# Patient Record
Sex: Female | Born: 1980 | Race: White | Hispanic: No | Marital: Single | Smoking: Current every day smoker
Health system: Southern US, Community
[De-identification: ages and names within clinical notes are randomized; demographics above are authoritative.]

## PROBLEM LIST (undated history)

## (undated) DIAGNOSIS — F319 Bipolar disorder, unspecified: Secondary | ICD-10-CM

## (undated) DIAGNOSIS — J45909 Unspecified asthma, uncomplicated: Secondary | ICD-10-CM

## (undated) DIAGNOSIS — F4310 Post-traumatic stress disorder, unspecified: Secondary | ICD-10-CM

## (undated) DIAGNOSIS — B029 Zoster without complications: Secondary | ICD-10-CM

## (undated) DIAGNOSIS — F603 Borderline personality disorder: Secondary | ICD-10-CM

## (undated) HISTORY — PX: WISDOM TOOTH EXTRACTION: SHX21

---

## 2013-04-01 ENCOUNTER — Encounter (HOSPITAL_COMMUNITY): Payer: Self-pay | Admitting: Emergency Medicine

## 2013-04-01 ENCOUNTER — Emergency Department (HOSPITAL_COMMUNITY)
Admission: EM | Admit: 2013-04-01 | Discharge: 2013-04-01 | Disposition: A | Discharge status: Home / Self Care | Payer: Self-pay | Attending: Emergency Medicine | Admitting: Emergency Medicine

## 2013-04-01 DIAGNOSIS — F17200 Nicotine dependence, unspecified, uncomplicated: Secondary | ICD-10-CM | POA: Insufficient documentation

## 2013-04-01 DIAGNOSIS — L089 Local infection of the skin and subcutaneous tissue, unspecified: Secondary | ICD-10-CM | POA: Insufficient documentation

## 2013-04-01 HISTORY — DX: Zoster without complications: B02.9

## 2013-04-01 MED ORDER — CEPHALEXIN 500 MG PO CAPS: 500.0000 mg | ORAL_CAPSULE | Freq: Four times a day (QID) | ORAL | Status: DC

## 2013-04-01 MED ORDER — PERMETHRIN 5 % EX CREA: TOPICAL_CREAM | CUTANEOUS | Status: DC

## 2013-04-01 NOTE — ED Provider Notes (Signed)
Medical screening examination/treatment/procedure(s) were performed by non-physician practitioner and as supervising physician I was immediately available for consultation/collaboration.  EKG Interpretation   None         Gwendolen Hewlett M Lasondra Hodgkins, MD 04/01/13 1727 

## 2013-04-01 NOTE — ED Notes (Signed)
Pt c/o red raised rash initially to legs and now spreading to abdomen x 5 days. Describes as mildly itchy and painful. Has been applying OTC creams, lotions with no relief. States 'Feels like dry skin."

## 2013-04-01 NOTE — ED Provider Notes (Signed)
CSN: [id number]     Arrival date & time 04/01/13  1029 History  This chart was scribed for non-physician practitioner, Emilia Beck, PA-C working with Enid Skeens, MD by Greggory Stallion, ED scribe. This patient was seen in room TR11C/TR11C and the patient's care was started at 11:45 AM.   Chief Complaint  Patient presents with  . Rash   The history is provided by the patient. No language interpreter was used.   HPI Comments: Barbara Maynard is a 32 y.o. female who presents to the Emergency Department complaining of gradually worsening red, raised rash to bilateral lower extremities that started 5 days ago. She states it has started to spread to her abdomen and bilateral upper extremities. It is mildly itchy. Pt has tried OTC creams with no relief. She states she has recently stayed in a hotel.   Past Medical History  Diagnosis Date  . Shingles    Past Surgical History  Procedure Laterality Date  . Wisdom tooth extraction     History reviewed. No pertinent family history. History  Substance Use Topics  . Smoking status: Current Every Day Smoker    Types: Cigarettes  . Smokeless tobacco: Not on file  . Alcohol Use: Yes   OB History   Grav Para Term Preterm Abortions TAB SAB Ect Mult Living                 Review of Systems  Skin: Positive for rash.  All other systems reviewed and are negative.    Allergies  Review of patient's allergies indicates no known allergies.  Home Medications   Current Outpatient Rx  Name  Route  Sig  Dispense  Refill  . albuterol (PROVENTIL HFA;VENTOLIN HFA) 108 (90 BASE) MCG/ACT inhaler   Inhalation   Inhale 2 puffs into the lungs every 6 (six) hours as needed for wheezing or shortness of breath.          BP 136/78  Pulse 73  Temp(Src) 98.3 F (36.8 C) (Oral)  Resp 20  Ht 5\' 7"  (1.702 m)  Wt 174 lb 14.4 oz (79.334 kg)  BMI 27.39 kg/m2  SpO2 98%  Physical Exam  Nursing note and vitals reviewed. Constitutional: She is  oriented to person, place, and time. She appears well-developed and well-nourished. No distress.  HENT:  Head: Normocephalic and atraumatic.  Eyes: EOM are normal.  Neck: Neck supple. No tracheal deviation present.  Cardiovascular: Normal rate, regular rhythm and normal heart sounds.   Pulmonary/Chest: Effort normal and breath sounds normal. No respiratory distress. She has no wheezes. She has no rales.  Musculoskeletal: Normal range of motion.  Neurological: She is alert and oriented to person, place, and time.  Skin: Skin is warm and dry. Rash noted.  Multiple scattered pustules on erythematous base with overlying scabbing. Located on bilateral arms, legs, abdomen, groin and back.   Psychiatric: She has a normal mood and affect. Her behavior is normal.    ED Course  Procedures (including critical care time)  DIAGNOSTIC STUDIES: Oxygen Saturation is 98% on RA, normal by my interpretation.    COORDINATION OF CARE: 11:46 AM-Discussed treatment plan which includes cream and antibiotic with pt at bedside and pt agreed to plan.   Labs Review Labs Reviewed - No data to display Imaging Review No results found.  EKG Interpretation   None       MDM   1. Skin infection     11:53 AM Patient will be treated with keflex and  permethrin. Patient has nonspecific skin infection. Vitals stable and patient afebrile. No further evaluation needed at this time.     I personally performed the services described in this documentation, which was scribed in my presence. The recorded information has been reviewed and is accurate.   Emilia Beck, PA-C 04/01/13 1431

## 2013-04-02 NOTE — Progress Notes (Signed)
   CARE MANAGEMENT ED NOTE 04/02/2013  Patient:  Barbara Maynard, Barbara Maynard   Account Number:  [id number]  Date Initiated:  04/02/2013  Documentation initiated by:  Edd Arbour  Subjective/Objective Assessment:   32 yr old female self pay pt recently moved to area seen at [facility] ED on 04/01/13 unable to afford elimite cream 5%     Subjective/Objective Assessment Detail:     Action/Plan:   Cm spoke with pt Her choice is [address] [facility]   Action/Plan Detail:   Anticipated DC Date:  04/01/2013     Status Recommendation to Physician:   Result of Recommendation:    Other ED Services  Consult Working Plan    DC Planning Services  PCP issues  Other  Medication Assistance  MATCH Program    Choice offered to / List presented to:            Status of service:  Completed, signed off  ED Comments:   ED Comments Detail:  CM reviewed EPIC notes and chart review information CM spoke with the pt about [facility] MATCH program ($3 co pay for each Rx through [facility] program, does not include refills, 7 day expiration of MATCH letter and choice of pharmacies) Pt agreed to receive assistance from program CM spoke with pt who confirms self pay [facility] resident with no pcp. CM discussed and provided written information for self pay pcps, importance of pcp for f/u care,  Pt voiced understanding and appreciation of resources provided Pt is eligible for [facility] MATCH program (unable to find pt listed in PDMI per cardholder name inquiry) PDMI information entered. MATCH letter completed and [facility]ed to [facility] [phone]

## 2013-04-02 NOTE — Progress Notes (Signed)
                  Dear _______Kristen _Lavkey___________________:  Bonita Quin have been approved to have your discharge prescriptions filled through our [facility] (Medication Assistance Through [facility]) program. This program allows for a one-time (no refills) 34-day supply of selected medications for a low copay amount.  The copay is $3.00 per prescription. For instance, if you have one prescription, you will pay $3.00; for two prescriptions, you pay $6.00; for three prescriptions, you pay $9.00; and so on.  Only certain pharmacies are participating in this program with [facility]. You will need to select one of the pharmacies from the attached list and take your prescriptions, this letter, and your photo ID to one of the participating pharmacies.   We are excited that you are able to use the [facility] program to get your medications. These prescriptions must be filled within 7 days of hospital discharge or they will no longer be valid for the [facility] program. Should you have any problems with your prescriptions please contact your case management team member at [phone].  Thank you,   [employer] [facility]   Participating [facility]  [facility]   [facility] [address] [place], [place]   [facility] [address] [place], [place]   [facility] [address] [place], [place]   [facility]   [address], [place], [place]   [address] [place], [place], [place]   [address], [place], [place]   [address] [place], [place]   [address] [place], [place]   [address], [place], [place]   [address] [place], [place]   [address] [place], [place]   [address], [place], [place]   [address] [place], [place], [place]  [facility]   [address], [place], [place]   [address] [place],  [place]   [address] [place], [place], [place]   [address] [place], [place]   [address], [place], [place]   [address] [place] [address] [place], [place], [place] [facility]   [address] [place], [place]   [address] [employer] [address], [place], [place]   [address] [place], [place]   [address], [place], [place]   [address], [place], [place]   [address], [place], [place]   [address], [place], [place]   [address], [place], [place]   [address], [place], [place]   [address], [place], [place]   [address], [place], [place]   [address], [place], [place]   [address] [place], [place]   [address] [place], [place], [place]  [facility]   [facility] [address] [place], [address] [place], [place]   [facility] [address] [place], [place]   [facility] [address] [place], [place]   For continued medication needs, please contact the [facility] Department at  [phone].

## 2013-04-11 ENCOUNTER — Encounter (HOSPITAL_COMMUNITY): Payer: Self-pay | Admitting: Emergency Medicine

## 2013-04-11 ENCOUNTER — Emergency Department (HOSPITAL_COMMUNITY)
Admission: EM | Admit: 2013-04-11 | Discharge: 2013-04-11 | Disposition: A | Discharge status: Home / Self Care | Payer: Medicaid - Out of State | Attending: Emergency Medicine | Admitting: Emergency Medicine

## 2013-04-11 DIAGNOSIS — Z79899 Other long term (current) drug therapy: Secondary | ICD-10-CM | POA: Insufficient documentation

## 2013-04-11 DIAGNOSIS — Z3202 Encounter for pregnancy test, result negative: Secondary | ICD-10-CM | POA: Insufficient documentation

## 2013-04-11 DIAGNOSIS — N951 Menopausal and female climacteric states: Secondary | ICD-10-CM | POA: Insufficient documentation

## 2013-04-11 DIAGNOSIS — R112 Nausea with vomiting, unspecified: Secondary | ICD-10-CM | POA: Insufficient documentation

## 2013-04-11 DIAGNOSIS — R509 Fever, unspecified: Secondary | ICD-10-CM | POA: Insufficient documentation

## 2013-04-11 DIAGNOSIS — J45909 Unspecified asthma, uncomplicated: Secondary | ICD-10-CM | POA: Insufficient documentation

## 2013-04-11 DIAGNOSIS — Z8619 Personal history of other infectious and parasitic diseases: Secondary | ICD-10-CM | POA: Insufficient documentation

## 2013-04-11 DIAGNOSIS — N949 Unspecified condition associated with female genital organs and menstrual cycle: Secondary | ICD-10-CM | POA: Insufficient documentation

## 2013-04-11 DIAGNOSIS — R232 Flushing: Secondary | ICD-10-CM

## 2013-04-11 DIAGNOSIS — R197 Diarrhea, unspecified: Secondary | ICD-10-CM | POA: Insufficient documentation

## 2013-04-11 DIAGNOSIS — N938 Other specified abnormal uterine and vaginal bleeding: Secondary | ICD-10-CM | POA: Insufficient documentation

## 2013-04-11 DIAGNOSIS — F17200 Nicotine dependence, unspecified, uncomplicated: Secondary | ICD-10-CM | POA: Insufficient documentation

## 2013-04-11 DIAGNOSIS — L42 Pityriasis rosea: Secondary | ICD-10-CM | POA: Insufficient documentation

## 2013-04-11 DIAGNOSIS — R5381 Other malaise: Secondary | ICD-10-CM | POA: Insufficient documentation

## 2013-04-11 DIAGNOSIS — R51 Headache: Secondary | ICD-10-CM | POA: Insufficient documentation

## 2013-04-11 HISTORY — DX: Unspecified asthma, uncomplicated: J45.909

## 2013-04-11 LAB — CBC WITH DIFFERENTIAL/PLATELET
Basophils Absolute: 0 10*3/uL (ref 0.0–0.1)
Hemoglobin: 12.9 g/dL (ref 12.0–15.0)
Lymphocytes Relative: 24 % (ref 12–46)
Lymphs Abs: 2.2 10*3/uL (ref 0.7–4.0)
MCV: 92.1 fL (ref 78.0–100.0)
Monocytes Absolute: 0.5 10*3/uL (ref 0.1–1.0)
Neutro Abs: 6.3 10*3/uL (ref 1.7–7.7)
Neutrophils Relative %: 70 % (ref 43–77)
Platelets: 193 10*3/uL (ref 150–400)
RBC: 4.06 MIL/uL (ref 3.87–5.11)
RDW: 13.1 % (ref 11.5–15.5)
WBC: 9 10*3/uL (ref 4.0–10.5)

## 2013-04-11 LAB — COMPREHENSIVE METABOLIC PANEL
ALT: 12 U/L (ref 0–35)
AST: 18 U/L (ref 0–37)
Albumin: 3.7 g/dL (ref 3.5–5.2)
Alkaline Phosphatase: 49 U/L (ref 39–117)
CO2: 23 mEq/L (ref 19–32)
Calcium: 9 mg/dL (ref 8.4–10.5)
Creatinine, Ser: 0.93 mg/dL (ref 0.50–1.10)
GFR calc Af Amer: >90 mL/min (ref >90)
GFR calc non Af Amer: 82 mL/min — ABNORMAL LOW (ref >90)
Glucose, Bld: 90 mg/dL (ref 70–99)
Potassium: 4.4 mEq/L (ref 3.5–5.1)
Sodium: 144 mEq/L (ref 135–145)

## 2013-04-11 LAB — URINALYSIS, ROUTINE W REFLEX MICROSCOPIC
Bilirubin Urine: NEGATIVE
Hgb urine dipstick: NEGATIVE
Ketones, ur: NEGATIVE mg/dL
Nitrite: NEGATIVE
Specific Gravity, Urine: 1.022 (ref 1.005–1.030)
Urobilinogen, UA: 0.2 mg/dL (ref 0.0–1.0)
pH: 6.5 (ref 5.0–8.0)

## 2013-04-11 LAB — GC/CHLAMYDIA PROBE AMP
CT Probe RNA: NEGATIVE
GC Probe RNA: NEGATIVE

## 2013-04-11 LAB — WET PREP, GENITAL: Yeast Wet Prep HPF POC: NONE SEEN

## 2013-04-11 LAB — PREGNANCY, URINE: Preg Test, Ur: NEGATIVE

## 2013-04-11 MED ORDER — IBUPROFEN 800 MG PO TABS
800.0000 mg | ORAL_TABLET | Freq: Once | ORAL | Status: AC
  Administered 2013-04-11: 800 mg via ORAL
  Filled 2013-04-11: qty 1

## 2013-04-11 NOTE — ED Provider Notes (Signed)
CSN: [id number]     Arrival date & time 04/11/13  0126 History   First MD Initiated Contact with Patient 04/11/13 0144     Chief Complaint  Patient presents with  . Vaginal Bleeding  . Fever  . Rash   (Consider location/radiation/quality/duration/timing/severity/associated sxs/prior Treatment) HPI 32 year old female presents to emergency room with complaint of persistent diffuse rash, irregular vaginal bleeding, subjective fevers, fatigue, and headache.  Patient was seen 10 days ago for rash, treated with Keflex and Elimite.  Patient reports nausea and vomiting, diarrhea, with antibiotic.  No improvement in the rash.  No one else around her has a similar rash.  Patient reports she had a regular menstrual period, followed by vaginal bleeding 5 days later.  She reports this bleeding was different, seemed more mucousy.  This lasted for 2-3 days.  No further bleeding.  She denies any vaginal discharge.  No lower abdominal pain.  Patient reports that she feels that she is hot, but has no fever  She has ongoing right-sided headache.  Past Medical History  Diagnosis Date  . Shingles   . Asthma    Past Surgical History  Procedure Laterality Date  . Wisdom tooth extraction    . Cesarean section     No family history on file. History  Substance Use Topics  . Smoking status: Current Every Day Smoker    Types: Cigarettes  . Smokeless tobacco: Not on file  . Alcohol Use: Yes   OB History   Grav Para Term Preterm Abortions TAB SAB Ect Mult Living                 Review of Systems  See History of Present Illness; otherwise all other systems are reviewed and negative Allergies  Review of patient's allergies indicates no known allergies.  Home Medications   Current Outpatient Rx  Name  Route  Sig  Dispense  Refill  . albuterol (PROVENTIL HFA;VENTOLIN HFA) 108 (90 BASE) MCG/ACT inhaler   Inhalation   Inhale 2 puffs into the lungs every 6 (six) hours as needed for wheezing or shortness  of breath.          BP 135/81  Pulse 82  Temp(Src) 98.5 F (36.9 C) (Oral)  Resp 16  SpO2 100%  LMP 03/27/2013 Physical Exam  Nursing note and vitals reviewed. Constitutional: She is oriented to person, place, and time. She appears well-developed and well-nourished.  HENT:  Head: Normocephalic and atraumatic.  Right Ear: External ear normal.  Left Ear: External ear normal.  Nose: Nose normal.  Mouth/Throat: Oropharynx is clear and moist.  Eyes: Conjunctivae and EOM are normal. Pupils are equal, round, and reactive to light.  Neck: Normal range of motion. Neck supple. No JVD present. No tracheal deviation present. No thyromegaly present.  Cardiovascular: Normal rate, regular rhythm, normal heart sounds and intact distal pulses.  Exam reveals no gallop and no friction rub.   No murmur heard. Pulmonary/Chest: Effort normal and breath sounds normal. No stridor. No respiratory distress. She has no wheezes. She has no rales. She exhibits no tenderness.  Abdominal: Soft. Bowel sounds are normal. She exhibits no distension and no mass. There is no tenderness. There is no rebound and no guarding.  Genitourinary:  External genitalia normal Vagina without discharge Cervix closed no lesions No cervical motion tenderness Adnexa palpated, no masses or tenderness noted Bladder palpated no tenderness Uterus palpated no masses or tenderness    Musculoskeletal: Normal range of motion. She exhibits no edema  and no tenderness.  Lymphadenopathy:    She has no cervical adenopathy.  Neurological: She is alert and oriented to person, place, and time. She exhibits normal muscle tone. Coordination normal.  Skin: Skin is warm and dry. Rash noted. No erythema. No pallor.  Patient has diffuse maculopapular rash over her trunk 6, and extremities.  The rash is red, she has some raised, scaling to the edges.  She has large, plaque-like rash to the inside of her thighs bilaterally.  There is a slight  Christmas tree pattern to the rash along her back.  Psychiatric: She has a normal mood and affect. Her behavior is normal. Judgment and thought content normal.    ED Course  Procedures (including critical care time) Labs Review Labs Reviewed  WET PREP, GENITAL - Abnormal; Notable for the following:    Clue Cells Wet Prep HPF POC MANY (*)    WBC, Wet Prep HPF POC FEW (*)    All other components within normal limits  COMPREHENSIVE METABOLIC PANEL - Abnormal; Notable for the following:    Total Bilirubin <0.1 (*)    GFR calc non Af Amer 82 (*)    All other components within normal limits  URINALYSIS, ROUTINE W REFLEX MICROSCOPIC - Abnormal; Notable for the following:    APPearance CLOUDY (*)    All other components within normal limits  GC/CHLAMYDIA PROBE AMP  CBC WITH DIFFERENTIAL  PREGNANCY, URINE   Imaging Review No results found.  EKG Interpretation   None       MDM   1. Pityriasis rosea   2. Hot flash not due to menopause   3. 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  Patient with dysfunctional uterine bleeding, but none now.  Pelvic exam unremarkable.  Labs unremarkable.  Suspect her systemic symptoms, arterial to her pityriasis.  Patient advised to use Tylenol or ibuprofen, increase fluid intake, and rest   Olivia Mackie, MD 04/11/13 [phone]

## 2013-04-11 NOTE — ED Notes (Signed)
Pelvic cart set up, ready for use, and at the bedside.

## 2013-04-11 NOTE — ED Notes (Signed)
Pt. reports intermittent vaginal bleeding / vaginal discharge , on and off fever , rashes at arms for several days .

## 2013-04-11 NOTE — ED Notes (Signed)
Pt A&Ox4. ambulatory at discharge, verbalizing no complaints at this time. Pt smiling and laughing.

## 2013-04-11 NOTE — ED Notes (Signed)
Dr. Norlene Campbell at bedside to assess patient.

## 2013-04-11 NOTE — ED Notes (Addendum)
Scattered, raised red papules noted to pts arms and legs bilaterally and trunk x 2 weeks. Seen here and given keflex with no relief. Pt also reports N/V/D x 1.5 weeks. Pt also reports abnormal vaginal bleeding intermittently x 2 weeks but currently not bleeding. Pt reports blood is thicker and more stringy than normal. Pt also reports she feels as though she is burning up and "I feel like my skin is hot." Pt also reports right sided headache.

## 2016-06-22 ENCOUNTER — Encounter (HOSPITAL_COMMUNITY): Payer: Self-pay | Admitting: Emergency Medicine

## 2016-06-22 ENCOUNTER — Emergency Department (HOSPITAL_COMMUNITY): Payer: Medicaid - Out of State

## 2016-06-22 ENCOUNTER — Emergency Department (HOSPITAL_COMMUNITY)
Admission: EM | Admit: 2016-06-22 | Discharge: 2016-06-22 | Disposition: A | Discharge status: Home / Self Care | Payer: Medicaid - Out of State | Attending: Emergency Medicine | Admitting: Emergency Medicine

## 2016-06-22 DIAGNOSIS — J111 Influenza due to unidentified influenza virus with other respiratory manifestations: Secondary | ICD-10-CM | POA: Diagnosis not present

## 2016-06-22 DIAGNOSIS — R69 Illness, unspecified: Secondary | ICD-10-CM

## 2016-06-22 DIAGNOSIS — J45909 Unspecified asthma, uncomplicated: Secondary | ICD-10-CM | POA: Diagnosis not present

## 2016-06-22 DIAGNOSIS — B9789 Other viral agents as the cause of diseases classified elsewhere: Secondary | ICD-10-CM

## 2016-06-22 DIAGNOSIS — L989 Disorder of the skin and subcutaneous tissue, unspecified: Secondary | ICD-10-CM | POA: Diagnosis not present

## 2016-06-22 DIAGNOSIS — J069 Acute upper respiratory infection, unspecified: Secondary | ICD-10-CM

## 2016-06-22 DIAGNOSIS — F17210 Nicotine dependence, cigarettes, uncomplicated: Secondary | ICD-10-CM | POA: Diagnosis not present

## 2016-06-22 DIAGNOSIS — R05 Cough: Secondary | ICD-10-CM | POA: Diagnosis present

## 2016-06-22 LAB — URINALYSIS, ROUTINE W REFLEX MICROSCOPIC
BACTERIA UA: NONE SEEN
Bilirubin Urine: NEGATIVE
Glucose, UA: NEGATIVE mg/dL
HGB URINE DIPSTICK: NEGATIVE
Ketones, ur: NEGATIVE mg/dL
Leukocytes, UA: NEGATIVE
Nitrite: NEGATIVE
PROTEIN: 30 mg/dL — AB
SPECIFIC GRAVITY, URINE: 1.028 (ref 1.005–1.030)
pH: 6 (ref 5.0–8.0)

## 2016-06-22 LAB — COMPREHENSIVE METABOLIC PANEL
ALT: 12 U/L — ABNORMAL LOW (ref 14–54)
ANION GAP: 5 (ref 5–15)
AST: 17 U/L (ref 15–41)
Albumin: 3.9 g/dL (ref 3.5–5.0)
Alkaline Phosphatase: 54 U/L (ref 38–126)
BUN: 8 mg/dL (ref 6–20)
CALCIUM: 9.2 mg/dL (ref 8.9–10.3)
CHLORIDE: 110 mmol/L (ref 101–111)
CO2: 25 mmol/L (ref 22–32)
Creatinine, Ser: 1.09 mg/dL — ABNORMAL HIGH (ref 0.44–1.00)
GFR calc Af Amer: >60 mL/min (ref >60)
GFR calc non Af Amer: >60 mL/min (ref >60)
Glucose, Bld: 107 mg/dL — ABNORMAL HIGH (ref 65–99)
Potassium: 4 mmol/L (ref 3.5–5.1)
SODIUM: 140 mmol/L (ref 135–145)
Total Bilirubin: 0.4 mg/dL (ref 0.3–1.2)
Total Protein: 6.6 g/dL (ref 6.5–8.1)

## 2016-06-22 LAB — CBC WITH DIFFERENTIAL/PLATELET
Basophils Absolute: 0 10*3/uL (ref 0.0–0.1)
Basophils Relative: 0 %
EOS ABS: 0.1 10*3/uL (ref 0.0–0.7)
Eosinophils Relative: 1 %
HCT: 39.1 % (ref 36.0–46.0)
Hemoglobin: 12.8 g/dL (ref 12.0–15.0)
LYMPHS PCT: 17 %
Lymphs Abs: 1.5 10*3/uL (ref 0.7–4.0)
MCH: 28.9 pg (ref 26.0–34.0)
MCHC: 32.7 g/dL (ref 30.0–36.0)
MCV: 88.3 fL (ref 78.0–100.0)
MONO ABS: 0.5 10*3/uL (ref 0.1–1.0)
MONOS PCT: 6 %
Neutro Abs: 6.7 10*3/uL (ref 1.7–7.7)
Neutrophils Relative %: 76 %
PLATELETS: 241 10*3/uL (ref 150–400)
RBC: 4.43 MIL/uL (ref 3.87–5.11)
RDW: 13.7 % (ref 11.5–15.5)
WBC: 8.9 10*3/uL (ref 4.0–10.5)

## 2016-06-22 LAB — I-STAT TROPONIN, ED: TROPONIN I, POC: 0 ng/mL (ref 0.00–0.08)

## 2016-06-22 LAB — I-STAT BETA HCG BLOOD, ED (MC, WL, AP ONLY): I-stat hCG, quantitative: <5 m[IU]/mL (ref <5)

## 2016-06-22 MED ORDER — CEPHALEXIN 500 MG PO CAPS: 500.0000 mg | ORAL_CAPSULE | Freq: Four times a day (QID) | ORAL | 0 refills | Status: DC

## 2016-06-22 NOTE — Discharge Instructions (Signed)
Your symptoms are likely secondary to viral respiratory infection. Drink fluids and get plenty of rest.   We will do a trial of keflex for your skin lesions.  Return for worsening symptoms, including fever, escalating pain, intractable vomiting or any other symptoms concerning to you.
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
## 2017-03-18 ENCOUNTER — Emergency Department (HOSPITAL_COMMUNITY): Payer: No Typology Code available for payment source

## 2017-03-18 ENCOUNTER — Emergency Department (HOSPITAL_COMMUNITY)
Admission: EM | Admit: 2017-03-18 | Discharge: 2017-03-18 | Disposition: A | Discharge status: Home / Self Care | Payer: No Typology Code available for payment source | Attending: Emergency Medicine | Admitting: Emergency Medicine

## 2017-03-18 DIAGNOSIS — S29012A Strain of muscle and tendon of back wall of thorax, initial encounter: Secondary | ICD-10-CM | POA: Insufficient documentation

## 2017-03-18 DIAGNOSIS — S20212A Contusion of left front wall of thorax, initial encounter: Secondary | ICD-10-CM | POA: Insufficient documentation

## 2017-03-18 DIAGNOSIS — Y998 Other external cause status: Secondary | ICD-10-CM | POA: Diagnosis not present

## 2017-03-18 DIAGNOSIS — F17210 Nicotine dependence, cigarettes, uncomplicated: Secondary | ICD-10-CM | POA: Diagnosis not present

## 2017-03-18 DIAGNOSIS — Y929 Unspecified place or not applicable: Secondary | ICD-10-CM | POA: Diagnosis not present

## 2017-03-18 DIAGNOSIS — J45909 Unspecified asthma, uncomplicated: Secondary | ICD-10-CM | POA: Diagnosis not present

## 2017-03-18 DIAGNOSIS — T1490XA Injury, unspecified, initial encounter: Secondary | ICD-10-CM

## 2017-03-18 DIAGNOSIS — S3991XA Unspecified injury of abdomen, initial encounter: Secondary | ICD-10-CM | POA: Diagnosis not present

## 2017-03-18 DIAGNOSIS — Y9389 Activity, other specified: Secondary | ICD-10-CM | POA: Insufficient documentation

## 2017-03-18 DIAGNOSIS — S299XXA Unspecified injury of thorax, initial encounter: Secondary | ICD-10-CM | POA: Diagnosis present

## 2017-03-18 DIAGNOSIS — K029 Dental caries, unspecified: Secondary | ICD-10-CM | POA: Diagnosis not present

## 2017-03-18 LAB — BASIC METABOLIC PANEL
Anion gap: 3 — ABNORMAL LOW (ref 5–15)
BUN: 13 mg/dL (ref 6–20)
CO2: 24 mmol/L (ref 22–32)
Calcium: 8.2 mg/dL — ABNORMAL LOW (ref 8.9–10.3)
Chloride: 110 mmol/L (ref 101–111)
Creatinine, Ser: 0.83 mg/dL (ref 0.44–1.00)
GFR calc non Af Amer: >60 mL/min (ref >60)
Glucose, Bld: 101 mg/dL — ABNORMAL HIGH (ref 65–99)
Potassium: 3.8 mmol/L (ref 3.5–5.1)
Sodium: 137 mmol/L (ref 135–145)

## 2017-03-18 LAB — CBC
HCT: 35 % — ABNORMAL LOW (ref 36.0–46.0)
Hemoglobin: 10.9 g/dL — ABNORMAL LOW (ref 12.0–15.0)
MCH: 26.9 pg (ref 26.0–34.0)
MCHC: 31.1 g/dL (ref 30.0–36.0)
MCV: 86.4 fL (ref 78.0–100.0)
Platelets: 185 10*3/uL (ref 150–400)
RBC: 4.05 MIL/uL (ref 3.87–5.11)
RDW: 15.6 % — ABNORMAL HIGH (ref 11.5–15.5)
WBC: 6.6 10*3/uL (ref 4.0–10.5)

## 2017-03-18 LAB — I-STAT TROPONIN, ED: TROPONIN I, POC: 0 ng/mL (ref 0.00–0.08)

## 2017-03-18 LAB — HEPATIC FUNCTION PANEL
ALBUMIN: 3.1 g/dL — AB (ref 3.5–5.0)
ALK PHOS: 46 U/L (ref 38–126)
ALT: 18 U/L (ref 14–54)
AST: 19 U/L (ref 15–41)
BILIRUBIN TOTAL: 0.3 mg/dL (ref 0.3–1.2)
Bilirubin, Direct: <0.1 mg/dL — ABNORMAL LOW (ref 0.1–0.5)
Total Protein: 5.8 g/dL — ABNORMAL LOW (ref 6.5–8.1)

## 2017-03-18 LAB — I-STAT BETA HCG BLOOD, ED (MC, WL, AP ONLY): I-stat hCG, quantitative: <5 m[IU]/mL (ref <5)

## 2017-03-18 LAB — LIPASE, BLOOD: LIPASE: 42 U/L (ref 11–51)

## 2017-03-18 MED ORDER — SODIUM CHLORIDE 0.9 % IV BOLUS (SEPSIS)
1000.0000 mL | Freq: Once | INTRAVENOUS | Status: AC
  Administered 2017-03-18: 1000 mL via INTRAVENOUS

## 2017-03-18 MED ORDER — HYDROCODONE-ACETAMINOPHEN 5-325 MG PO TABS: 1.0000 | ORAL_TABLET | ORAL | 0 refills | Status: DC | PRN

## 2017-03-18 MED ORDER — MORPHINE SULFATE (PF) 4 MG/ML IV SOLN
4.0000 mg | Freq: Once | INTRAVENOUS | Status: AC
  Administered 2017-03-18: 4 mg via INTRAVENOUS
  Filled 2017-03-18: qty 1

## 2017-03-18 MED ORDER — CYCLOBENZAPRINE HCL 5 MG PO TABS: 5.0000 mg | ORAL_TABLET | Freq: Three times a day (TID) | ORAL | 0 refills | Status: DC | PRN

## 2017-03-18 MED ORDER — AMOXICILLIN-POT CLAVULANATE 875-125 MG PO TABS
1.0000 | ORAL_TABLET | Freq: Once | ORAL | Status: AC
  Administered 2017-03-18: 1 via ORAL
  Filled 2017-03-18: qty 1

## 2017-03-18 MED ORDER — IBUPROFEN 600 MG PO TABS: 600.0000 mg | ORAL_TABLET | Freq: Four times a day (QID) | ORAL | 0 refills | Status: DC | PRN

## 2017-03-18 MED ORDER — DIAZEPAM 5 MG/ML IJ SOLN
5.0000 mg | Freq: Once | INTRAMUSCULAR | Status: AC
  Administered 2017-03-18: 5 mg via INTRAVENOUS
  Filled 2017-03-18: qty 2

## 2017-03-18 MED ORDER — AMOXICILLIN-POT CLAVULANATE 875-125 MG PO TABS: 1.0000 | ORAL_TABLET | Freq: Two times a day (BID) | ORAL | 0 refills | Status: DC

## 2017-03-18 NOTE — Discharge Instructions (Signed)
Take motrin for pain.   Take flexeril for muscle spasms.   Take vicodin for severe pain. Do not drive with it.   Take augmentin twice daily for a week for tooth infection.   See a dentist. See [facility] center for follow up.   Return to ER if you have worse abdominal pain, chest pain, flank pain, rib pain, vomiting, fever
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
## 2017-09-04 ENCOUNTER — Emergency Department (HOSPITAL_COMMUNITY)
Admission: EM | Admit: 2017-09-04 | Discharge: 2017-09-04 | Disposition: A | Discharge status: Home / Self Care | Payer: Self-pay | Attending: Emergency Medicine | Admitting: Emergency Medicine

## 2017-09-04 ENCOUNTER — Emergency Department (HOSPITAL_COMMUNITY): Payer: Self-pay

## 2017-09-04 ENCOUNTER — Other Ambulatory Visit: Payer: Self-pay

## 2017-09-04 ENCOUNTER — Encounter (HOSPITAL_COMMUNITY): Payer: Self-pay | Admitting: Emergency Medicine

## 2017-09-04 DIAGNOSIS — S00511A Abrasion of lip, initial encounter: Secondary | ICD-10-CM | POA: Insufficient documentation

## 2017-09-04 DIAGNOSIS — Y9389 Activity, other specified: Secondary | ICD-10-CM | POA: Insufficient documentation

## 2017-09-04 DIAGNOSIS — S0990XA Unspecified injury of head, initial encounter: Secondary | ICD-10-CM | POA: Insufficient documentation

## 2017-09-04 DIAGNOSIS — K0889 Other specified disorders of teeth and supporting structures: Secondary | ICD-10-CM | POA: Insufficient documentation

## 2017-09-04 DIAGNOSIS — F17210 Nicotine dependence, cigarettes, uncomplicated: Secondary | ICD-10-CM | POA: Insufficient documentation

## 2017-09-04 DIAGNOSIS — Y999 Unspecified external cause status: Secondary | ICD-10-CM | POA: Insufficient documentation

## 2017-09-04 DIAGNOSIS — R55 Syncope and collapse: Secondary | ICD-10-CM | POA: Insufficient documentation

## 2017-09-04 DIAGNOSIS — S81011A Laceration without foreign body, right knee, initial encounter: Secondary | ICD-10-CM | POA: Insufficient documentation

## 2017-09-04 DIAGNOSIS — Y92410 Unspecified street and highway as the place of occurrence of the external cause: Secondary | ICD-10-CM | POA: Insufficient documentation

## 2017-09-04 DIAGNOSIS — M549 Dorsalgia, unspecified: Secondary | ICD-10-CM | POA: Insufficient documentation

## 2017-09-04 DIAGNOSIS — J45909 Unspecified asthma, uncomplicated: Secondary | ICD-10-CM | POA: Insufficient documentation

## 2017-09-04 LAB — CBC WITH DIFFERENTIAL/PLATELET
Basophils Absolute: 0 10*3/uL (ref 0.0–0.1)
Basophils Relative: 0 %
Eosinophils Absolute: 0.1 10*3/uL (ref 0.0–0.7)
Eosinophils Relative: 1 %
HCT: 35.9 % — ABNORMAL LOW (ref 36.0–46.0)
Hemoglobin: 11.4 g/dL — ABNORMAL LOW (ref 12.0–15.0)
Lymphocytes Relative: 19 %
Lymphs Abs: 2 10*3/uL (ref 0.7–4.0)
MCH: 27.8 pg (ref 26.0–34.0)
MCHC: 31.8 g/dL (ref 30.0–36.0)
MCV: 87.6 fL (ref 78.0–100.0)
Monocytes Absolute: 0.6 10*3/uL (ref 0.1–1.0)
Monocytes Relative: 6 %
Neutro Abs: 7.4 10*3/uL (ref 1.7–7.7)
Neutrophils Relative %: 74 %
Platelets: 228 10*3/uL (ref 150–400)
RBC: 4.1 MIL/uL (ref 3.87–5.11)
RDW: 15.2 % (ref 11.5–15.5)
WBC: 10.1 10*3/uL (ref 4.0–10.5)

## 2017-09-04 LAB — COMPREHENSIVE METABOLIC PANEL
ALT: 18 U/L (ref 14–54)
AST: 24 U/L (ref 15–41)
Albumin: 3.7 g/dL (ref 3.5–5.0)
Alkaline Phosphatase: 54 U/L (ref 38–126)
Anion gap: 12 (ref 5–15)
BUN: 13 mg/dL (ref 6–20)
CO2: 22 mmol/L (ref 22–32)
Calcium: 9.1 mg/dL (ref 8.9–10.3)
Chloride: 105 mmol/L (ref 101–111)
Creatinine, Ser: 0.89 mg/dL (ref 0.44–1.00)
GFR calc Af Amer: >60 mL/min (ref >60)
GFR calc non Af Amer: >60 mL/min (ref >60)
Glucose, Bld: 77 mg/dL (ref 65–99)
Potassium: 3.1 mmol/L — ABNORMAL LOW (ref 3.5–5.1)
Sodium: 139 mmol/L (ref 135–145)
Total Bilirubin: 1 mg/dL (ref 0.3–1.2)
Total Protein: 6 g/dL — ABNORMAL LOW (ref 6.5–8.1)

## 2017-09-04 LAB — RAPID URINE DRUG SCREEN, HOSP PERFORMED
Amphetamines: NOT DETECTED
Barbiturates: NOT DETECTED
Benzodiazepines: NOT DETECTED
Cocaine: POSITIVE — AB
Opiates: POSITIVE — AB
Tetrahydrocannabinol: POSITIVE — AB

## 2017-09-04 LAB — PREGNANCY, URINE: Preg Test, Ur: NEGATIVE

## 2017-09-04 LAB — ETHANOL: Alcohol, Ethyl (B): <10 mg/dL (ref <10)

## 2017-09-04 LAB — CBG MONITORING, ED: Glucose-Capillary: 80 mg/dL (ref 65–99)

## 2017-09-04 MED ORDER — KETOROLAC TROMETHAMINE 30 MG/ML IJ SOLN
30.0000 mg | Freq: Once | INTRAMUSCULAR | Status: DC
  Filled 2017-09-04: qty 1

## 2017-09-04 MED ORDER — ACETAMINOPHEN 500 MG PO TABS: 500.0000 mg | ORAL_TABLET | Freq: Four times a day (QID) | ORAL | 0 refills | Status: DC | PRN

## 2017-09-04 MED ORDER — METHOCARBAMOL 500 MG PO TABS: 500.0000 mg | ORAL_TABLET | Freq: Two times a day (BID) | ORAL | 0 refills | Status: DC

## 2017-09-04 MED ORDER — TETANUS-DIPHTH-ACELL PERTUSSIS 5-2.5-18.5 LF-MCG/0.5 IM SUSP
0.5000 mL | Freq: Once | INTRAMUSCULAR | Status: DC
  Filled 2017-09-04: qty 0.5

## 2017-09-04 MED ORDER — POTASSIUM CHLORIDE CRYS ER 20 MEQ PO TBCR
60.0000 meq | EXTENDED_RELEASE_TABLET | Freq: Once | ORAL | Status: AC
  Administered 2017-09-04: 60 meq via ORAL
  Filled 2017-09-04: qty 3

## 2017-09-04 MED ORDER — NAPROXEN 500 MG PO TABS: 500.0000 mg | ORAL_TABLET | Freq: Two times a day (BID) | ORAL | 0 refills | Status: DC
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
## 2017-09-04 NOTE — ED Notes (Signed)
Urine collected at 11:30. Urine noted to be very cloudy and odorous.
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
## 2017-09-04 NOTE — ED Notes (Addendum)
Patient refuses to answer any questions stating she is "too tired right now". Patient also refused to get into gown and stated "I will leave unless you get a different doctor in here to help me". Patient has been uncooperative in helping with her care and refused to answer any questions. MD notified.

## 2017-09-04 NOTE — ED Notes (Signed)
Patient resting in bed, friend at bedside. Patient not cooperative with staff in getting her vitals signs or assessment.

## 2017-09-04 NOTE — ED Notes (Signed)
As pt was going to lobby to wait she relized her neck hurt.  Collar applied

## 2017-09-04 NOTE — ED Notes (Signed)
Pt will not allow me to re check vital signs. Pt is not cooperative at this time.

## 2017-09-04 NOTE — ED Notes (Signed)
Patient transported to X-ray and CT 

## 2017-09-04 NOTE — ED Notes (Signed)
Attempted to clean patient's abrasion to R knee, wiped with sterile saline x 1 and patient refused further care. Offered her the rest of sterile saline, gauze, and kerlex wrap and she stated she would clean it later at home. Friend at bedside states he will help her take care of it as well. Patient refused to sign for discharge papers, but verbalized understanding and denies further questions or needs at this time. Assisted to ED entrance in wheelchair.

## 2017-09-09 ENCOUNTER — Other Ambulatory Visit: Payer: Self-pay

## 2017-09-09 ENCOUNTER — Emergency Department (HOSPITAL_COMMUNITY): Payer: Self-pay

## 2017-09-09 ENCOUNTER — Inpatient Hospital Stay (HOSPITAL_COMMUNITY)
Admission: EM | Admit: 2017-09-09 | Discharge: 2017-09-11 | DRG: 814 | Disposition: A | Discharge status: Home / Self Care | Payer: Self-pay | Attending: General Surgery | Admitting: General Surgery

## 2017-09-09 ENCOUNTER — Encounter (HOSPITAL_COMMUNITY): Payer: Self-pay | Admitting: Emergency Medicine

## 2017-09-09 DIAGNOSIS — F1410 Cocaine abuse, uncomplicated: Secondary | ICD-10-CM | POA: Diagnosis present

## 2017-09-09 DIAGNOSIS — S36032A Major laceration of spleen, initial encounter: Principal | ICD-10-CM | POA: Diagnosis present

## 2017-09-09 DIAGNOSIS — S2232XA Fracture of one rib, left side, initial encounter for closed fracture: Secondary | ICD-10-CM | POA: Diagnosis present

## 2017-09-09 DIAGNOSIS — Z79899 Other long term (current) drug therapy: Secondary | ICD-10-CM

## 2017-09-09 DIAGNOSIS — K661 Hemoperitoneum: Secondary | ICD-10-CM | POA: Diagnosis present

## 2017-09-09 DIAGNOSIS — F3110 Bipolar disorder, current episode manic without psychotic features, unspecified: Secondary | ICD-10-CM | POA: Diagnosis present

## 2017-09-09 DIAGNOSIS — F4310 Post-traumatic stress disorder, unspecified: Secondary | ICD-10-CM | POA: Diagnosis present

## 2017-09-09 DIAGNOSIS — S3609XA Other injury of spleen, initial encounter: Secondary | ICD-10-CM

## 2017-09-09 DIAGNOSIS — Z9119 Patient's noncompliance with other medical treatment and regimen: Secondary | ICD-10-CM

## 2017-09-09 DIAGNOSIS — F603 Borderline personality disorder: Secondary | ICD-10-CM | POA: Diagnosis present

## 2017-09-09 DIAGNOSIS — Y92410 Unspecified street and highway as the place of occurrence of the external cause: Secondary | ICD-10-CM

## 2017-09-09 DIAGNOSIS — F17210 Nicotine dependence, cigarettes, uncomplicated: Secondary | ICD-10-CM | POA: Diagnosis present

## 2017-09-09 DIAGNOSIS — S3600XA Unspecified injury of spleen, initial encounter: Secondary | ICD-10-CM | POA: Diagnosis present

## 2017-09-09 DIAGNOSIS — D62 Acute posthemorrhagic anemia: Secondary | ICD-10-CM | POA: Diagnosis present

## 2017-09-09 HISTORY — DX: Bipolar disorder, unspecified: F31.9

## 2017-09-09 HISTORY — DX: Borderline personality disorder: F60.3

## 2017-09-09 HISTORY — DX: Post-traumatic stress disorder, unspecified: F43.10

## 2017-09-09 LAB — CBC WITH DIFFERENTIAL/PLATELET
Basophils Absolute: 0 10*3/uL (ref 0.0–0.1)
Basophils Relative: 0 %
Eosinophils Absolute: 0 10*3/uL (ref 0.0–0.7)
Eosinophils Relative: 0 %
HCT: 27.9 % — ABNORMAL LOW (ref 36.0–46.0)
Hemoglobin: 9.1 g/dL — ABNORMAL LOW (ref 12.0–15.0)
Lymphocytes Relative: 17 %
Lymphs Abs: 1.1 10*3/uL (ref 0.7–4.0)
MCH: 28.2 pg (ref 26.0–34.0)
MCHC: 32.6 g/dL (ref 30.0–36.0)
MCV: 86.4 fL (ref 78.0–100.0)
Monocytes Absolute: 0.4 10*3/uL (ref 0.1–1.0)
Monocytes Relative: 6 %
Neutro Abs: 5.3 10*3/uL (ref 1.7–7.7)
Neutrophils Relative %: 77 %
Platelets: 165 10*3/uL (ref 150–400)
RBC: 3.23 MIL/uL — ABNORMAL LOW (ref 3.87–5.11)
RDW: 15.1 % (ref 11.5–15.5)
WBC: 6.9 10*3/uL (ref 4.0–10.5)

## 2017-09-09 LAB — URINALYSIS, ROUTINE W REFLEX MICROSCOPIC
Glucose, UA: NEGATIVE mg/dL
Hgb urine dipstick: NEGATIVE
Ketones, ur: NEGATIVE mg/dL
Leukocytes, UA: NEGATIVE
Nitrite: NEGATIVE
Protein, ur: 30 mg/dL — AB
Specific Gravity, Urine: 1.03 (ref 1.005–1.030)
pH: 5 (ref 5.0–8.0)

## 2017-09-09 LAB — COMPREHENSIVE METABOLIC PANEL
ALT: 22 U/L (ref 14–54)
AST: 27 U/L (ref 15–41)
Albumin: 3.1 g/dL — ABNORMAL LOW (ref 3.5–5.0)
Alkaline Phosphatase: 50 U/L (ref 38–126)
Anion gap: 8 (ref 5–15)
BUN: 9 mg/dL (ref 6–20)
CO2: 24 mmol/L (ref 22–32)
Calcium: 8.4 mg/dL — ABNORMAL LOW (ref 8.9–10.3)
Chloride: 104 mmol/L (ref 101–111)
Creatinine, Ser: 0.82 mg/dL (ref 0.44–1.00)
GFR calc Af Amer: >60 mL/min (ref >60)
GFR calc non Af Amer: >60 mL/min (ref >60)
Glucose, Bld: 127 mg/dL — ABNORMAL HIGH (ref 65–99)
Potassium: 3.4 mmol/L — ABNORMAL LOW (ref 3.5–5.1)
Sodium: 136 mmol/L (ref 135–145)
Total Bilirubin: 0.4 mg/dL (ref 0.3–1.2)
Total Protein: 5.6 g/dL — ABNORMAL LOW (ref 6.5–8.1)

## 2017-09-09 LAB — I-STAT BETA HCG BLOOD, ED (MC, WL, AP ONLY)

## 2017-09-09 LAB — LIPASE, BLOOD: Lipase: 34 U/L (ref 11–51)

## 2017-09-09 MED ORDER — HYDROMORPHONE HCL 2 MG/ML IJ SOLN: 0.5000 mg | INTRAMUSCULAR | Status: DC | PRN

## 2017-09-09 MED ORDER — TRAMADOL HCL 50 MG PO TABS
50.0000 mg | ORAL_TABLET | Freq: Once | ORAL | Status: AC
  Administered 2017-09-09: 50 mg via ORAL
  Filled 2017-09-09: qty 1

## 2017-09-09 MED ORDER — HYDROMORPHONE HCL 1 MG/ML IJ SOLN: 1.0000 mg | INTRAMUSCULAR | Status: DC | PRN

## 2017-09-09 MED ORDER — HYDROMORPHONE HCL 1 MG/ML IJ SOLN
1.0000 mg | INTRAMUSCULAR | Status: DC | PRN
  Administered 2017-09-10 – 2017-09-11 (×11): 1 mg via INTRAVENOUS
  Filled 2017-09-09 (×12): qty 1

## 2017-09-09 MED ORDER — HYDROMORPHONE HCL 2 MG/ML IJ SOLN
1.0000 mg | Freq: Once | INTRAMUSCULAR | Status: AC
  Administered 2017-09-09: 1 mg via INTRAVENOUS
  Filled 2017-09-09: qty 1

## 2017-09-09 MED ORDER — ONDANSETRON HCL 4 MG/2ML IJ SOLN
4.0000 mg | Freq: Four times a day (QID) | INTRAMUSCULAR | Status: DC | PRN
  Administered 2017-09-10: 4 mg via INTRAVENOUS
  Filled 2017-09-09: qty 2

## 2017-09-09 MED ORDER — ONDANSETRON 4 MG PO TBDP: 4.0000 mg | ORAL_TABLET | Freq: Four times a day (QID) | ORAL | Status: DC | PRN

## 2017-09-09 MED ORDER — KETOROLAC TROMETHAMINE 60 MG/2ML IM SOLN
60.0000 mg | Freq: Once | INTRAMUSCULAR | Status: DC
  Filled 2017-09-09: qty 2

## 2017-09-09 MED ORDER — METOPROLOL TARTRATE 5 MG/5ML IV SOLN: 5.0000 mg | Freq: Four times a day (QID) | INTRAVENOUS | Status: DC | PRN

## 2017-09-09 MED ORDER — IOPAMIDOL (ISOVUE-300) INJECTION 61%
INTRAVENOUS | Status: AC
  Administered 2017-09-09: 100 mL
  Filled 2017-09-09: qty 100

## 2017-09-09 MED ORDER — LORAZEPAM 2 MG/ML IJ SOLN
1.0000 mg | INTRAMUSCULAR | Status: DC | PRN
  Administered 2017-09-10 – 2017-09-11 (×8): 1 mg via INTRAVENOUS
  Filled 2017-09-09 (×9): qty 1

## 2017-09-09 MED ORDER — HYDROMORPHONE HCL 1 MG/ML IJ SOLN: 0.5000 mg | INTRAMUSCULAR | Status: DC | PRN

## 2017-09-09 MED ORDER — HYDROMORPHONE HCL 2 MG/ML IJ SOLN
1.0000 mg | INTRAMUSCULAR | Status: DC | PRN
  Administered 2017-09-09 (×2): 1 mg via INTRAVENOUS
  Filled 2017-09-09 (×2): qty 1

## 2017-09-09 MED ORDER — HYDROMORPHONE HCL 1 MG/ML IJ SOLN
0.5000 mg | INTRAMUSCULAR | Status: DC | PRN
  Administered 2017-09-11: 0.5 mg via INTRAVENOUS
  Filled 2017-09-09: qty 1

## 2017-09-09 MED ORDER — LORAZEPAM 2 MG/ML IJ SOLN
1.0000 mg | Freq: Four times a day (QID) | INTRAMUSCULAR | Status: DC | PRN
  Administered 2017-09-09: 1 mg via INTRAVENOUS
  Filled 2017-09-09: qty 1

## 2017-09-09 MED ORDER — SODIUM CHLORIDE 0.9 % IV SOLN
INTRAVENOUS | Status: DC
  Administered 2017-09-09 – 2017-09-10 (×2): via INTRAVENOUS

## 2017-09-09 MED ORDER — ONDANSETRON HCL 4 MG/2ML IJ SOLN
4.0000 mg | Freq: Once | INTRAMUSCULAR | Status: AC
  Administered 2017-09-09: 4 mg via INTRAVENOUS
  Filled 2017-09-09: qty 2
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
## 2017-09-09 NOTE — ED Notes (Signed)
Mother of patient reports the following as the meds the patient is supposed to be taking but has not been on in the last 3 months. Pharm tech was unable to contact either out of state pharmacy to verify and therefore unable to place in pts history.   Prozac 40mg  BID Wellbutrin 150mg  Daily Trazadone 50mg  HS Visteral 25mg  1-2 tabs q4-6 hours PRN anxiety.
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
## 2017-09-10 DIAGNOSIS — F17210 Nicotine dependence, cigarettes, uncomplicated: Secondary | ICD-10-CM

## 2017-09-10 DIAGNOSIS — F1290 Cannabis use, unspecified, uncomplicated: Secondary | ICD-10-CM

## 2017-09-10 DIAGNOSIS — F419 Anxiety disorder, unspecified: Secondary | ICD-10-CM

## 2017-09-10 DIAGNOSIS — S3600XA Unspecified injury of spleen, initial encounter: Secondary | ICD-10-CM

## 2017-09-10 DIAGNOSIS — F3110 Bipolar disorder, current episode manic without psychotic features, unspecified: Secondary | ICD-10-CM | POA: Diagnosis present

## 2017-09-10 DIAGNOSIS — R4587 Impulsiveness: Secondary | ICD-10-CM

## 2017-09-10 DIAGNOSIS — R450 Nervousness: Secondary | ICD-10-CM

## 2017-09-10 LAB — COMPREHENSIVE METABOLIC PANEL
ALK PHOS: 49 U/L (ref 38–126)
ALT: 20 U/L (ref 14–54)
ANION GAP: 7 (ref 5–15)
AST: 22 U/L (ref 15–41)
Albumin: 3.3 g/dL — ABNORMAL LOW (ref 3.5–5.0)
BILIRUBIN TOTAL: 0.1 mg/dL — AB (ref 0.3–1.2)
BUN: 9 mg/dL (ref 6–20)
CALCIUM: 8.4 mg/dL — AB (ref 8.9–10.3)
CO2: 27 mmol/L (ref 22–32)
Chloride: 105 mmol/L (ref 101–111)
Creatinine, Ser: 0.8 mg/dL (ref 0.44–1.00)
Glucose, Bld: 106 mg/dL — ABNORMAL HIGH (ref 65–99)
POTASSIUM: 3.5 mmol/L (ref 3.5–5.1)
Sodium: 139 mmol/L (ref 135–145)
TOTAL PROTEIN: 5.8 g/dL — AB (ref 6.5–8.1)

## 2017-09-10 LAB — CBC
HCT: 26.4 % — ABNORMAL LOW (ref 36.0–46.0)
HEMATOCRIT: 24.5 % — AB (ref 36.0–46.0)
HEMATOCRIT: 23.8 % — AB (ref 36.0–46.0)
HEMOGLOBIN: 7.9 g/dL — AB (ref 12.0–15.0)
Hemoglobin: 8.5 g/dL — ABNORMAL LOW (ref 12.0–15.0)
Hemoglobin: 7.7 g/dL — ABNORMAL LOW (ref 12.0–15.0)
MCH: 28.7 pg (ref 26.0–34.0)
MCH: 28.7 pg (ref 26.0–34.0)
MCH: 28.6 pg (ref 26.0–34.0)
MCHC: 32.2 g/dL (ref 30.0–36.0)
MCHC: 32.4 g/dL (ref 30.0–36.0)
MCHC: 32.2 g/dL (ref 30.0–36.0)
MCV: 88.9 fL (ref 78.0–100.0)
MCV: 88.8 fL (ref 78.0–100.0)
MCV: 89.1 fL (ref 78.0–100.0)
PLATELETS: 131 10*3/uL — AB (ref 150–400)
PLATELETS: 150 10*3/uL (ref 150–400)
Platelets: 142 10*3/uL — ABNORMAL LOW (ref 150–400)
RBC: 2.97 MIL/uL — ABNORMAL LOW (ref 3.87–5.11)
RBC: 2.68 MIL/uL — AB (ref 3.87–5.11)
RBC: 2.75 MIL/uL — ABNORMAL LOW (ref 3.87–5.11)
RDW: 15.6 % — AB (ref 11.5–15.5)
RDW: 15.6 % — ABNORMAL HIGH (ref 11.5–15.5)
RDW: 15.5 % (ref 11.5–15.5)
WBC: 5.4 10*3/uL (ref 4.0–10.5)
WBC: 4.7 10*3/uL (ref 4.0–10.5)
WBC: 4.3 10*3/uL (ref 4.0–10.5)

## 2017-09-10 LAB — ABO/RH: ABO/RH(D): A POS

## 2017-09-10 LAB — PROTIME-INR
INR: 0.98
PROTHROMBIN TIME: 12.9 s (ref 11.4–15.2)

## 2017-09-10 LAB — MRSA PCR SCREENING: MRSA BY PCR: NEGATIVE

## 2017-09-10 LAB — HIV ANTIBODY (ROUTINE TESTING W REFLEX): HIV Screen 4th Generation wRfx: NONREACTIVE

## 2017-09-10 LAB — PREPARE RBC (CROSSMATCH)

## 2017-09-10 MED ORDER — OXYCODONE HCL 5 MG PO TABS: 5.0000 mg | ORAL_TABLET | ORAL | Status: DC | PRN

## 2017-09-10 MED ORDER — SODIUM CHLORIDE 0.9 % IV SOLN: Freq: Once | INTRAVENOUS | Status: DC

## 2017-09-10 MED ORDER — WHITE PETROLATUM EX OINT
TOPICAL_OINTMENT | CUTANEOUS | Status: AC
  Filled 2017-09-10: qty 28.35
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
## 2017-09-10 NOTE — Progress Notes (Signed)
Pt was non-compliant and refusing all care for the first 3 hours of the shift (1900-2200) - stating that she was not receiving good care while at the hospital, would not listen when asked to sit down Radiographer, therapeutic(safety sitter and RN attempted to stay with pt until safely seated) and clearly unsafe to ambulate alone. She would also not let us get a set of vitals on her.  Able to get a full set of vitals on pt finally around 2310.   Will continue to monitor as much as possible.  Francia GreavesSavannah R Tanette Chauca, RN
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
## 2017-09-10 NOTE — Progress Notes (Signed)
Patient ID: Red ChristiansKristen Goodley, female   DOB: July 11, 1980, 37 y.o.   MRN: [id number] Hb stabilizing. Advance diet. Await psychiatry eval.  Violeta GelinasBurke Alayia Meggison, MD, MPH, FACS Trauma: [phone] General Surgery: [phone]

## 2017-09-11 ENCOUNTER — Emergency Department (HOSPITAL_COMMUNITY)
Admission: EM | Admit: 2017-09-11 | Discharge: 2017-09-11 | Disposition: A | Discharge status: Home / Self Care | Payer: Medicaid - Out of State | Attending: Emergency Medicine | Admitting: Emergency Medicine

## 2017-09-11 ENCOUNTER — Encounter (HOSPITAL_COMMUNITY): Payer: Self-pay

## 2017-09-11 ENCOUNTER — Other Ambulatory Visit: Payer: Self-pay

## 2017-09-11 DIAGNOSIS — F17210 Nicotine dependence, cigarettes, uncomplicated: Secondary | ICD-10-CM | POA: Insufficient documentation

## 2017-09-11 DIAGNOSIS — S36039D Unspecified laceration of spleen, subsequent encounter: Secondary | ICD-10-CM | POA: Insufficient documentation

## 2017-09-11 DIAGNOSIS — F319 Bipolar disorder, unspecified: Secondary | ICD-10-CM | POA: Insufficient documentation

## 2017-09-11 DIAGNOSIS — R1012 Left upper quadrant pain: Secondary | ICD-10-CM

## 2017-09-11 DIAGNOSIS — X58XXXD Exposure to other specified factors, subsequent encounter: Secondary | ICD-10-CM | POA: Insufficient documentation

## 2017-09-11 LAB — CBC
HCT: 27.8 % — ABNORMAL LOW (ref 36.0–46.0)
HEMATOCRIT: 24.4 % — AB (ref 36.0–46.0)
HEMOGLOBIN: 8.8 g/dL — AB (ref 12.0–15.0)
Hemoglobin: 8.1 g/dL — ABNORMAL LOW (ref 12.0–15.0)
MCH: 27.9 pg (ref 26.0–34.0)
MCH: 29.1 pg (ref 26.0–34.0)
MCHC: 31.7 g/dL (ref 30.0–36.0)
MCHC: 33.2 g/dL (ref 30.0–36.0)
MCV: 88.3 fL (ref 78.0–100.0)
MCV: 87.8 fL (ref 78.0–100.0)
PLATELETS: 177 10*3/uL (ref 150–400)
Platelets: 171 10*3/uL (ref 150–400)
RBC: 3.15 MIL/uL — AB (ref 3.87–5.11)
RBC: 2.78 MIL/uL — ABNORMAL LOW (ref 3.87–5.11)
RDW: 14.9 % (ref 11.5–15.5)
RDW: 14.7 % (ref 11.5–15.5)
WBC: 5.4 10*3/uL (ref 4.0–10.5)
WBC: 5.8 10*3/uL (ref 4.0–10.5)

## 2017-09-11 LAB — COMPREHENSIVE METABOLIC PANEL
ALT: 17 U/L (ref 14–54)
AST: 19 U/L (ref 15–41)
Albumin: 3.6 g/dL (ref 3.5–5.0)
Alkaline Phosphatase: 48 U/L (ref 38–126)
Anion gap: 9 (ref 5–15)
BILIRUBIN TOTAL: 0.5 mg/dL (ref 0.3–1.2)
BUN: 8 mg/dL (ref 6–20)
CHLORIDE: 107 mmol/L (ref 101–111)
CO2: 25 mmol/L (ref 22–32)
Calcium: 8.9 mg/dL (ref 8.9–10.3)
Creatinine, Ser: 0.82 mg/dL (ref 0.44–1.00)
GFR calc Af Amer: >60 mL/min (ref >60)
Glucose, Bld: 95 mg/dL (ref 65–99)
POTASSIUM: 4.1 mmol/L (ref 3.5–5.1)
Sodium: 141 mmol/L (ref 135–145)
TOTAL PROTEIN: 6.4 g/dL — AB (ref 6.5–8.1)

## 2017-09-11 MED ORDER — DIVALPROEX SODIUM 500 MG PO DR TAB: 500.0000 mg | DELAYED_RELEASE_TABLET | Freq: Two times a day (BID) | ORAL | 0 refills | Status: DC

## 2017-09-11 MED ORDER — DIVALPROEX SODIUM 500 MG PO DR TAB
500.0000 mg | DELAYED_RELEASE_TABLET | Freq: Two times a day (BID) | ORAL | Status: DC
  Administered 2017-09-11: 500 mg via ORAL
  Filled 2017-09-11: qty 1

## 2017-09-11 NOTE — Progress Notes (Signed)
Pt refused discharge instructions and removal of IV. MD stated IV must be removed. IV was removed and pt was given prescription.  Pt was walked off unit with LawyerHeather RN.

## 2017-09-11 NOTE — ED Notes (Signed)
Patient lethargic in Triage. Patient states "I will take a M----- F------ pee test."

## 2017-09-11 NOTE — ED Notes (Addendum)
Patient walked out of emergency dept through EMS doors. Requested pt come back inside to go over discharge paperwork, pt denied. Pt agreed to have paperwork brought out to her. Discharge paperwork was discussed and pt was given a bus pass. Unable to obtain signature
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
## 2017-09-11 NOTE — ED Notes (Signed)
Pt/s only complaint at this time is flank pain and this writer talking too loud.
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
## 2017-09-11 NOTE — ED Notes (Signed)
Nurse was politely trying to get Pt's triage and asking Pt triage questions, Pt was cussing at nurse not wanting to answer questions and being rude to staff. Pt lethargic and drooling on counter mumbling her words. Tech obtained vital signs and took Pt to room where she was asked to change into a gown and cigarettes were placed on counter at bedside. Tech left to get Pt blanket. When returning with blanket Pt was changing and had taken off shoes and socks and stated that she had a 10 dollar bill in sock and it was missing. Told Pt I had not seen $10 and that I would check the hallway. Tech checked wheelchair that she was in as well as hallway to room. Did not see money. Nurse informed. While trying to document Pt walked to triage station from her room talking about she knows she had her money and was looking for it. Pt was informed that we had not seen it and she was walked back to her room.
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
## 2017-09-12 LAB — TYPE AND SCREEN
ABO/RH(D): A POS
Antibody Screen: NEGATIVE
UNIT DIVISION: 0
Unit division: 0

## 2017-09-12 LAB — BPAM RBC
BLOOD PRODUCT EXPIRATION DATE: 201904202359
Blood Product Expiration Date: 201905112359
Unit Type and Rh: 600
Unit Type and Rh: 6200

## 2019-03-02 ENCOUNTER — Encounter (HOSPITAL_COMMUNITY): Payer: Self-pay | Admitting: Obstetrics and Gynecology

## 2019-03-02 ENCOUNTER — Emergency Department (HOSPITAL_COMMUNITY)
Admission: EM | Admit: 2019-03-02 | Discharge: 2019-03-02 | Disposition: A | Discharge status: Home / Self Care | Payer: Medicaid - Out of State | Attending: Emergency Medicine | Admitting: Emergency Medicine

## 2019-03-02 ENCOUNTER — Other Ambulatory Visit: Payer: Self-pay

## 2019-03-02 DIAGNOSIS — Y939 Activity, unspecified: Secondary | ICD-10-CM | POA: Insufficient documentation

## 2019-03-02 DIAGNOSIS — L02415 Cutaneous abscess of right lower limb: Secondary | ICD-10-CM | POA: Insufficient documentation

## 2019-03-02 DIAGNOSIS — L02416 Cutaneous abscess of left lower limb: Secondary | ICD-10-CM

## 2019-03-02 DIAGNOSIS — R454 Irritability and anger: Secondary | ICD-10-CM | POA: Insufficient documentation

## 2019-03-02 DIAGNOSIS — Y999 Unspecified external cause status: Secondary | ICD-10-CM | POA: Insufficient documentation

## 2019-03-02 DIAGNOSIS — J45909 Unspecified asthma, uncomplicated: Secondary | ICD-10-CM | POA: Insufficient documentation

## 2019-03-02 DIAGNOSIS — S80861A Insect bite (nonvenomous), right lower leg, initial encounter: Secondary | ICD-10-CM | POA: Insufficient documentation

## 2019-03-02 DIAGNOSIS — Y92099 Unspecified place in other non-institutional residence as the place of occurrence of the external cause: Secondary | ICD-10-CM | POA: Insufficient documentation

## 2019-03-02 DIAGNOSIS — F17210 Nicotine dependence, cigarettes, uncomplicated: Secondary | ICD-10-CM | POA: Insufficient documentation

## 2019-03-02 DIAGNOSIS — L03115 Cellulitis of right lower limb: Secondary | ICD-10-CM | POA: Insufficient documentation

## 2019-03-02 DIAGNOSIS — W57XXXA Bitten or stung by nonvenomous insect and other nonvenomous arthropods, initial encounter: Secondary | ICD-10-CM | POA: Insufficient documentation

## 2019-03-02 MED ORDER — SULFAMETHOXAZOLE-TRIMETHOPRIM 800-160 MG PO TABS: 1.0000 | ORAL_TABLET | Freq: Two times a day (BID) | ORAL | 0 refills | Status: AC

## 2019-03-02 MED ORDER — IBUPROFEN 800 MG PO TABS: 800.0000 mg | ORAL_TABLET | Freq: Three times a day (TID) | ORAL | 0 refills | Status: DC

## 2019-03-02 MED ORDER — IBUPROFEN 800 MG PO TABS
800.0000 mg | ORAL_TABLET | Freq: Once | ORAL | Status: AC
  Administered 2019-03-02: 800 mg via ORAL
  Filled 2019-03-02: qty 1

## 2019-03-02 MED ORDER — HYDROCODONE-ACETAMINOPHEN 5-325 MG PO TABS
1.0000 | ORAL_TABLET | Freq: Once | ORAL | Status: AC
  Administered 2019-03-02: 1 via ORAL
  Filled 2019-03-02: qty 1

## 2019-03-02 MED ORDER — SULFAMETHOXAZOLE-TRIMETHOPRIM 800-160 MG PO TABS
1.0000 | ORAL_TABLET | Freq: Once | ORAL | Status: AC
  Administered 2019-03-02: 1 via ORAL
  Filled 2019-03-02: qty 1

## 2019-03-02 MED ORDER — LIDOCAINE-EPINEPHRINE (PF) 2 %-1:200000 IJ SOLN
10.0000 mL | Freq: Once | INTRAMUSCULAR | Status: AC
  Administered 2019-03-02: 10 mL
  Filled 2019-03-02: qty 10

## 2019-03-02 MED ORDER — SULFAMETHOXAZOLE-TRIMETHOPRIM 800-160 MG PO TABS: 1.0000 | ORAL_TABLET | Freq: Two times a day (BID) | ORAL | 0 refills | Status: DC

## 2019-03-02 NOTE — ED Notes (Signed)
Patient demanding transportation back to hotel. Patient informed that we did not have cab vouchers and would have to provide own transportation back home.

## 2019-03-02 NOTE — ED Triage Notes (Signed)
Pt BIBA for c/o spider bite to right leg. Patient reports pain and difficulty walking. Patient reports the pain is radiating into her abdomen.
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
## 2019-03-02 NOTE — Discharge Instructions (Signed)
Please read and follow all provided instructions.  Your diagnoses today include:  1. Cellulitis and abscess of left leg     Tests performed today include:  Vital signs. See below for your results today.   Medications prescribed:   Ibuprofen (Motrin, Advil) - anti-inflammatory pain medication  Do not exceed 800mg  ibuprofen every 8 hours, take with food  You have been prescribed an anti-inflammatory medication or NSAID. Take with food. Take smallest effective dose for the shortest duration needed for your pain. Stop taking if you experience stomach pain or vomiting.    Bactrim (trimethoprim/sulfamethoxazole) - antibiotic  You have been prescribed an antibiotic medicine: take the entire course of medicine even if you are feeling better. Stopping early can cause the antibiotic not to work.  Take any prescribed medications only as directed.   Home care instructions:  Follow any educational materials contained in this packet. Keep affected area above the level of your heart when possible. Wash area gently twice a day with warm soapy water. Do not apply alcohol or hydrogen peroxide. Cover the area if it draining or weeping.   Follow-up instructions:  Return to the Emergency Department in 48 hours for a recheck.  Return instructions:  Return to the Emergency Department if you have:  Fever  Worsening symptoms  Worsening pain  Worsening swelling  Redness of the skin that moves away from the affected area, especially if it streaks away from the affected area   Any other emergent concerns  Your vital signs today were: BP 133/73 (BP Location: Right Arm)    Pulse 73    Temp 99.3 F (37.4 C) (Oral)    Resp 16    SpO2 100%  If your blood pressure (BP) was elevated above 135/85 this visit, please have this repeated by your doctor within one month. --------------

## 2019-03-02 NOTE — ED Notes (Signed)
Patient demanding to see this RN's supervisor. Patient angry that she is in a chair. Patient angry she has not gotten pain medication. Patient crying and screaming that she is hurting and wants a bed and pain medication and to talk to my supervisor.  Charge RN made aware.

## 2021-10-23 ENCOUNTER — Emergency Department (HOSPITAL_COMMUNITY): Payer: Self-pay

## 2021-10-23 ENCOUNTER — Emergency Department (HOSPITAL_COMMUNITY)
Admission: EM | Admit: 2021-10-23 | Discharge: 2021-10-23 | Disposition: A | Discharge status: Home / Self Care | Payer: Self-pay | Attending: Emergency Medicine | Admitting: Emergency Medicine

## 2021-10-23 ENCOUNTER — Other Ambulatory Visit: Payer: Self-pay

## 2021-10-23 DIAGNOSIS — L03211 Cellulitis of face: Secondary | ICD-10-CM

## 2021-10-23 DIAGNOSIS — K047 Periapical abscess without sinus: Secondary | ICD-10-CM

## 2021-10-23 DIAGNOSIS — N9489 Other specified conditions associated with female genital organs and menstrual cycle: Secondary | ICD-10-CM | POA: Insufficient documentation

## 2021-10-23 DIAGNOSIS — Z20822 Contact with and (suspected) exposure to covid-19: Secondary | ICD-10-CM | POA: Insufficient documentation

## 2021-10-23 DIAGNOSIS — R220 Localized swelling, mass and lump, head: Secondary | ICD-10-CM | POA: Insufficient documentation

## 2021-10-23 LAB — COMPREHENSIVE METABOLIC PANEL
ALT: 11 U/L (ref 0–44)
AST: 15 U/L (ref 15–41)
Albumin: 3.4 g/dL — ABNORMAL LOW (ref 3.5–5.0)
Alkaline Phosphatase: 50 U/L (ref 38–126)
Anion gap: 6 (ref 5–15)
BUN: 6 mg/dL (ref 6–20)
CO2: 26 mmol/L (ref 22–32)
Calcium: 8.7 mg/dL — ABNORMAL LOW (ref 8.9–10.3)
Chloride: 106 mmol/L (ref 98–111)
Creatinine, Ser: 0.9 mg/dL (ref 0.44–1.00)
GFR, Estimated: >60 mL/min (ref >60)
Glucose, Bld: 96 mg/dL (ref 70–99)
Potassium: 3.4 mmol/L — ABNORMAL LOW (ref 3.5–5.1)
Sodium: 138 mmol/L (ref 135–145)
Total Bilirubin: 0.3 mg/dL (ref 0.3–1.2)
Total Protein: 6.1 g/dL — ABNORMAL LOW (ref 6.5–8.1)

## 2021-10-23 LAB — CBC WITH DIFFERENTIAL/PLATELET
Abs Immature Granulocytes: 0.03 10*3/uL (ref 0.00–0.07)
Basophils Absolute: 0 10*3/uL (ref 0.0–0.1)
Basophils Relative: 0 %
Eosinophils Absolute: 0 10*3/uL (ref 0.0–0.5)
Eosinophils Relative: 0 %
HCT: 38 % (ref 36.0–46.0)
Hemoglobin: 12.2 g/dL (ref 12.0–15.0)
Immature Granulocytes: 0 %
Lymphocytes Relative: 15 %
Lymphs Abs: 1.5 10*3/uL (ref 0.7–4.0)
MCH: 28.2 pg (ref 26.0–34.0)
MCHC: 32.1 g/dL (ref 30.0–36.0)
MCV: 88 fL (ref 80.0–100.0)
Monocytes Absolute: 0.6 10*3/uL (ref 0.1–1.0)
Monocytes Relative: 6 %
Neutro Abs: 7.7 10*3/uL (ref 1.7–7.7)
Neutrophils Relative %: 79 %
Platelets: 208 10*3/uL (ref 150–400)
RBC: 4.32 MIL/uL (ref 3.87–5.11)
RDW: 14.8 % (ref 11.5–15.5)
WBC: 9.9 10*3/uL (ref 4.0–10.5)
nRBC: 0 % (ref 0.0–0.2)

## 2021-10-23 LAB — I-STAT BETA HCG BLOOD, ED (MC, WL, AP ONLY): I-stat hCG, quantitative: <5 m[IU]/mL (ref <5)

## 2021-10-23 LAB — URINALYSIS, ROUTINE W REFLEX MICROSCOPIC
Bilirubin Urine: NEGATIVE
Glucose, UA: NEGATIVE mg/dL
Hgb urine dipstick: NEGATIVE
Ketones, ur: NEGATIVE mg/dL
Leukocytes,Ua: NEGATIVE
Nitrite: POSITIVE — AB
Protein, ur: NEGATIVE mg/dL
Specific Gravity, Urine: >1.046 — ABNORMAL HIGH (ref 1.005–1.030)
pH: 6 (ref 5.0–8.0)

## 2021-10-23 LAB — SARS CORONAVIRUS 2 BY RT PCR: SARS Coronavirus 2 by RT PCR: NEGATIVE

## 2021-10-23 LAB — LACTIC ACID, PLASMA
Lactic Acid, Venous: 0.9 mmol/L (ref 0.5–1.9)
Lactic Acid, Venous: 0.7 mmol/L (ref 0.5–1.9)

## 2021-10-23 MED ORDER — AMOXICILLIN-POT CLAVULANATE 875-125 MG PO TABS: 1.0000 | ORAL_TABLET | Freq: Two times a day (BID) | ORAL | 0 refills | Status: DC

## 2021-10-23 MED ORDER — IOHEXOL 300 MG/ML  SOLN
100.0000 mL | Freq: Once | INTRAMUSCULAR | Status: AC | PRN
  Administered 2021-10-23: 100 mL via INTRAVENOUS

## 2021-10-23 MED ORDER — SODIUM CHLORIDE 0.9 % IV SOLN
3.0000 g | Freq: Once | INTRAVENOUS | Status: AC
  Administered 2021-10-23: 3 g via INTRAVENOUS
  Filled 2021-10-23: qty 8
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
## 2021-10-23 NOTE — ED Notes (Signed)
ED Provider at bedside. 

## 2021-10-23 NOTE — Discharge Instructions (Signed)
Take the antibiotics as prescribed.  Use Tylenol or Motrin as needed for aches and fevers.  Follow-up with your primary doctor as well as dentist.  Return to the ED with difficulty breathing, difficulty swallowing, not able to eat or drink or any other concerns.

## 2021-10-23 NOTE — ED Notes (Signed)
Patient transported to CT 
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
## 2021-10-23 NOTE — ED Notes (Signed)
Pt ambulatory to restroom unassisted  

## 2021-10-24 LAB — CULTURE, BLOOD (ROUTINE X 2): Culture: NO GROWTH

## 2021-10-27 LAB — URINE CULTURE: Culture: 40000 — AB

## 2021-10-27 LAB — CULTURE, BLOOD (ROUTINE X 2): Special Requests: ADEQUATE

## 2021-10-28 ENCOUNTER — Telehealth: Payer: Self-pay | Admitting: *Deleted

## 2021-10-28 LAB — CULTURE, BLOOD (ROUTINE X 2)
Culture: NO GROWTH
Special Requests: ADEQUATE

## 2021-10-28 NOTE — Telephone Encounter (Signed)
Post ED Visit - Positive Culture Follow-up  Culture report reviewed by antimicrobial stewardship pharmacist: [place] Team []  Elenor Quinones, Pharm.D. []  Heide Guile, Pharm.D., BCPS AQ-ID []  Parks Neptune, Pharm.D., BCPS []  Alycia Rossetti, Pharm.D., BCPS []  Turner, Pharm.D., BCPS, AAHIVP []  Legrand Como, Pharm.D., BCPS, AAHIVP []  Salome Arnt, PharmD, BCPS []  Johnnette Gourd, PharmD, BCPS []  Hughes Better, PharmD, BCPS []  Leeroy Cha, PharmD []  Laqueta Linden, PharmD, BCPS []  Albertina Parr, PharmD  [place] Team []  Leodis Sias, PharmD []  Lindell Spar, PharmD []  Royetta Asal, PharmD []  Graylin Shiver, Rph []  Rema Fendt) Glennon Mac, PharmD []  Arlyn Dunning, PharmD []  Netta Cedars, PharmD []  Dia Sitter, PharmD []  Leone Haven, PharmD []  Gretta Arab, PharmD []  Theodis Shove, PharmD []  Peggyann Juba, PharmD []  Reuel Boom, PharmD   Positive urine culture Treated with Amoxicillin-Pot Clavulanate, organism sensitive to the same and no further patient follow-up is required at this time. Wynonia Musty, PharmD  [facility] 10/28/2021, 9:36 AM

## 2022-07-05 ENCOUNTER — Encounter (HOSPITAL_COMMUNITY): Payer: Self-pay

## 2022-07-05 ENCOUNTER — Other Ambulatory Visit: Payer: Self-pay

## 2022-07-05 ENCOUNTER — Emergency Department (HOSPITAL_COMMUNITY)
Admission: EM | Admit: 2022-07-05 | Discharge: 2022-07-05 | Disposition: A | Discharge status: Home / Self Care | Payer: Self-pay | Attending: Emergency Medicine | Admitting: Emergency Medicine

## 2022-07-05 ENCOUNTER — Emergency Department (HOSPITAL_COMMUNITY): Payer: Self-pay

## 2022-07-05 DIAGNOSIS — K0889 Other specified disorders of teeth and supporting structures: Secondary | ICD-10-CM

## 2022-07-05 DIAGNOSIS — Z20822 Contact with and (suspected) exposure to covid-19: Secondary | ICD-10-CM | POA: Insufficient documentation

## 2022-07-05 DIAGNOSIS — K529 Noninfective gastroenteritis and colitis, unspecified: Secondary | ICD-10-CM

## 2022-07-05 LAB — CBC WITH DIFFERENTIAL/PLATELET
Abs Immature Granulocytes: 0.01 10*3/uL (ref 0.00–0.07)
Basophils Absolute: 0 10*3/uL (ref 0.0–0.1)
Basophils Relative: 0 %
Eosinophils Absolute: 0 10*3/uL (ref 0.0–0.5)
Eosinophils Relative: 0 %
HCT: 40.9 % (ref 36.0–46.0)
Hemoglobin: 13.2 g/dL (ref 12.0–15.0)
Immature Granulocytes: 0 %
Lymphocytes Relative: 8 %
Lymphs Abs: 0.4 10*3/uL — ABNORMAL LOW (ref 0.7–4.0)
MCH: 29.7 pg (ref 26.0–34.0)
MCHC: 32.3 g/dL (ref 30.0–36.0)
MCV: 91.9 fL (ref 80.0–100.0)
Monocytes Absolute: 0.2 10*3/uL (ref 0.1–1.0)
Monocytes Relative: 5 %
Neutro Abs: 3.9 10*3/uL (ref 1.7–7.7)
Neutrophils Relative %: 87 %
Platelets: 219 10*3/uL (ref 150–400)
RBC: 4.45 MIL/uL (ref 3.87–5.11)
RDW: 13.3 % (ref 11.5–15.5)
WBC: 4.5 10*3/uL (ref 4.0–10.5)
nRBC: 0 % (ref 0.0–0.2)

## 2022-07-05 LAB — COMPREHENSIVE METABOLIC PANEL
ALT: 19 U/L (ref 0–44)
AST: 29 U/L (ref 15–41)
Albumin: 3.4 g/dL — ABNORMAL LOW (ref 3.5–5.0)
Alkaline Phosphatase: 43 U/L (ref 38–126)
Anion gap: 10 (ref 5–15)
BUN: 8 mg/dL (ref 6–20)
CO2: 24 mmol/L (ref 22–32)
Calcium: 9 mg/dL (ref 8.9–10.3)
Chloride: 102 mmol/L (ref 98–111)
Creatinine, Ser: 0.79 mg/dL (ref 0.44–1.00)
GFR, Estimated: >60 mL/min (ref >60)
Glucose, Bld: 121 mg/dL — ABNORMAL HIGH (ref 70–99)
Potassium: 3.6 mmol/L (ref 3.5–5.1)
Sodium: 136 mmol/L (ref 135–145)
Total Bilirubin: 0.2 mg/dL — ABNORMAL LOW (ref 0.3–1.2)
Total Protein: 6.2 g/dL — ABNORMAL LOW (ref 6.5–8.1)

## 2022-07-05 LAB — RESP PANEL BY RT-PCR (RSV, FLU A&B, COVID)  RVPGX2
Influenza A by PCR: NEGATIVE
Influenza B by PCR: NEGATIVE
Resp Syncytial Virus by PCR: NEGATIVE
SARS Coronavirus 2 by RT PCR: NEGATIVE

## 2022-07-05 LAB — URINALYSIS, ROUTINE W REFLEX MICROSCOPIC
Bilirubin Urine: NEGATIVE
Glucose, UA: NEGATIVE mg/dL
Hgb urine dipstick: NEGATIVE
Ketones, ur: NEGATIVE mg/dL
Leukocytes,Ua: NEGATIVE
Nitrite: NEGATIVE
Protein, ur: NEGATIVE mg/dL
Specific Gravity, Urine: 1.024 (ref 1.005–1.030)
pH: 5 (ref 5.0–8.0)

## 2022-07-05 LAB — LIPASE, BLOOD: Lipase: 39 U/L (ref 11–51)

## 2022-07-05 LAB — HCG, QUANTITATIVE, PREGNANCY: hCG, Beta Chain, Quant, S: <1 m[IU]/mL (ref <5)

## 2022-07-05 MED ORDER — AMOXICILLIN 500 MG PO CAPS: 500.0000 mg | ORAL_CAPSULE | Freq: Three times a day (TID) | ORAL | 0 refills | Status: DC

## 2022-07-05 MED ORDER — IOHEXOL 350 MG/ML SOLN
75.0000 mL | Freq: Once | INTRAVENOUS | Status: AC | PRN
  Administered 2022-07-05: 75 mL via INTRAVENOUS

## 2022-07-05 MED ORDER — ONDANSETRON 4 MG PO TBDP
4.0000 mg | ORAL_TABLET | Freq: Once | ORAL | Status: AC
  Administered 2022-07-05: 4 mg via ORAL
  Filled 2022-07-05: qty 1

## 2022-07-05 MED ORDER — HYDROCODONE-ACETAMINOPHEN 5-325 MG PO TABS
2.0000 | ORAL_TABLET | Freq: Once | ORAL | Status: AC
  Administered 2022-07-05: 2 via ORAL
  Filled 2022-07-05: qty 2

## 2022-07-05 MED ORDER — DICYCLOMINE HCL 20 MG PO TABS: 20.0000 mg | ORAL_TABLET | Freq: Two times a day (BID) | ORAL | 0 refills | Status: DC

## 2022-07-05 MED ORDER — ONDANSETRON 4 MG PO TBDP: 4.0000 mg | ORAL_TABLET | Freq: Three times a day (TID) | ORAL | 0 refills | Status: DC | PRN

## 2022-07-05 NOTE — ED Triage Notes (Signed)
Pt arrives with c/o ABD pain that started 2 days ago. Pt endorses n/v/d and fever. Per pt, she has had issues with her gallbladder in the past.

## 2022-07-05 NOTE — ED Notes (Addendum)
Pt requesting to speak to "whoever's in charge of this floor because I know medical terminology & I am being denied because I don't have insurance. I know for a fact that I need a CT & have been waiting for a CT for hours." This RN advised her that we'd been waiting for bloodwork/ results to come back to get her to CT. Pt still requesting charge nurse at this time, so charge nurse made aware.
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
## 2022-07-05 NOTE — Discharge Instructions (Signed)
Please read and follow all provided instructions.  Your diagnoses today include:  1. Enteritis   2. Pain, dental     Tests performed today include: Blood cell counts and platelets Kidney and liver function tests Pancreas function test (called lipase) Urine test to look for infection A blood or urine test for pregnancy (women only) CT scan of your abdomen pelvis: Shows inflammation in the small intestine Vital signs. See below for your results today.   Medications prescribed:  Bentyl - medication for intestinal cramps and spasms  Zofran (ondansetron) - for nausea and vomiting  Amoxicillin - antibiotic  You have been prescribed an antibiotic medicine: take the entire course of medicine even if you are feeling better. Stopping early can cause the antibiotic not to work.  Take any prescribed medications only as directed.  Home care instructions:  Follow any educational materials contained in this packet.  Follow-up instructions: Please follow-up with your primary care provider in the next 3 days for further evaluation of your symptoms.    Return instructions:  SEEK IMMEDIATE MEDICAL ATTENTION IF: The pain does not go away or becomes severe  A temperature above 101F develops  Repeated vomiting occurs (multiple episodes)  The pain becomes localized to portions of the abdomen. The right side could possibly be appendicitis. In an adult, the left lower portion of the abdomen could be colitis or diverticulitis.  Blood is being passed in stools or vomit (bright red or black tarry stools)  You develop chest pain, difficulty breathing, dizziness or fainting, or become confused, poorly responsive, or inconsolable (young children) If you have any other emergent concerns regarding your health  Additional Information: Abdominal (belly) pain can be caused by many things. Your caregiver performed an examination and possibly ordered blood/urine tests and imaging (CT scan, x-rays, ultrasound).  Many cases can be observed and treated at home after initial evaluation in the emergency department. Even though you are being discharged home, abdominal pain can be unpredictable. Therefore, you need a repeated exam if your pain does not resolve, returns, or worsens. Most patients with abdominal pain don't have to be admitted to the hospital or have surgery, but serious problems like appendicitis and gallbladder attacks can start out as nonspecific pain. Many abdominal conditions cannot be diagnosed in one visit, so follow-up evaluations are very important.  Your vital signs today were: BP (!) 145/74 (BP Location: Right Arm)   Pulse 70   Temp 97.6 F (36.4 C) (Oral)   Resp (!) 23   Wt 59 kg   SpO2 99%   BMI 21.63 kg/m  If your blood pressure (bp) was elevated above 135/85 this visit, please have this repeated by your doctor within one month. --------------

## 2022-07-05 NOTE — ED Triage Notes (Signed)
Pt states nausea medication has worn off and would like something else.
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
## 2022-10-19 ENCOUNTER — Emergency Department (HOSPITAL_COMMUNITY)
Admission: EM | Admit: 2022-10-19 | Discharge: 2022-10-19 | Disposition: A | Discharge status: Home / Self Care | Payer: Self-pay | Attending: Emergency Medicine | Admitting: Emergency Medicine

## 2022-10-19 ENCOUNTER — Encounter (HOSPITAL_COMMUNITY): Payer: Self-pay | Admitting: Emergency Medicine

## 2022-10-19 ENCOUNTER — Other Ambulatory Visit: Payer: Self-pay

## 2022-10-19 DIAGNOSIS — K0381 Cracked tooth: Secondary | ICD-10-CM | POA: Insufficient documentation

## 2022-10-19 DIAGNOSIS — K0889 Other specified disorders of teeth and supporting structures: Secondary | ICD-10-CM

## 2022-10-19 DIAGNOSIS — N3000 Acute cystitis without hematuria: Secondary | ICD-10-CM | POA: Insufficient documentation

## 2022-10-19 LAB — URINALYSIS, ROUTINE W REFLEX MICROSCOPIC
Bilirubin Urine: NEGATIVE
Glucose, UA: NEGATIVE mg/dL
Hgb urine dipstick: NEGATIVE
Ketones, ur: NEGATIVE mg/dL
Nitrite: NEGATIVE
Protein, ur: NEGATIVE mg/dL
Specific Gravity, Urine: 1.02 (ref 1.005–1.030)
WBC, UA: >50 WBC/hpf (ref 0–5)
pH: 5 (ref 5.0–8.0)

## 2022-10-19 MED ORDER — METRONIDAZOLE 500 MG PO TABS: 500.0000 mg | ORAL_TABLET | Freq: Two times a day (BID) | ORAL | 0 refills | Status: AC

## 2022-10-19 MED ORDER — CEPHALEXIN 500 MG PO CAPS: 500.0000 mg | ORAL_CAPSULE | Freq: Three times a day (TID) | ORAL | 0 refills | Status: AC

## 2022-10-19 NOTE — Discharge Instructions (Addendum)
Keflex and Flagyl given to treat both dental pain and UTI. Complete 7 day course of both medications. Do not drink alcohol while on Flagyl.  Follow up with dentist in 1 week for evaluation.   Instructed patient to return if she develops fever, chills, or persistent/worsening pain.

## 2022-10-19 NOTE — ED Triage Notes (Signed)
Pt via POV c/o right lower dental pain, throat pain, and possible UTI. Pain currently 7/10, poor dentition noted. No current rx. Pt reports burning urination with frequency, no abnormal discharge.

## 2022-10-19 NOTE — ED Provider Notes (Signed)
[place] EMERGENCY DEPARTMENT AT [facility] Provider Note   CSN: [id number] Arrival date & time: 10/19/22  1607     History  Chief Complaint  Patient presents with   Dental Pain    Barbara Maynard is a 42 y.o. female who presents to the ED for dental pain for the past 4 days.  Patient reports similar symptoms in the past and believes she might have an infection.  She has poor dentition and reports pain to her gum and 4 bottom teeth. Denies any exacerbating or alleviating factors.  Additionally she reports burning and pain with urination. She denies fever, chills, hematuria, or flank pain.     Home Medications Prior to Admission medications   Medication Sig Start Date End Date Taking? Authorizing Provider  cephALEXin (KEFLEX) 500 MG capsule Take 1 capsule (500 mg total) by mouth 3 (three) times daily for 7 days. 10/19/22 10/26/22 Yes Maxwell Marion, PA-C  metroNIDAZOLE (FLAGYL) 500 MG tablet Take 1 tablet (500 mg total) by mouth 2 (two) times daily for 7 days. 10/19/22 10/26/22 Yes Maxwell Marion, PA-C  acetaminophen (TYLENOL) 500 MG tablet Take 1 tablet (500 mg total) by mouth every 6 (six) hours as needed. 09/04/17   Law, Waylan Boga, PA-C  amoxicillin (AMOXIL) 500 MG capsule Take 1 capsule (500 mg total) by mouth 3 (three) times daily. 07/05/22   Renne Crigler, PA-C  dicyclomine (BENTYL) 20 MG tablet Take 1 tablet (20 mg total) by mouth 2 (two) times daily. 07/05/22   Renne Crigler, PA-C  divalproex (DEPAKOTE) 500 MG DR tablet Take 1 tablet (500 mg total) by mouth every 12 (twelve) hours. 09/11/17   Violeta Gelinas, MD  ibuprofen (ADVIL) 800 MG tablet Take 1 tablet (800 mg total) by mouth 3 (three) times daily. 03/02/19   Renne Crigler, PA-C  methocarbamol (ROBAXIN) 500 MG tablet Take 1 tablet (500 mg total) by mouth 2 (two) times daily. Patient not taking: Reported on 09/09/2017 09/04/17   Emi Holes, PA-C  ondansetron (ZOFRAN-ODT) 4 MG disintegrating tablet Take 1 tablet (4 mg  total) by mouth every 8 (eight) hours as needed for nausea or vomiting. 07/05/22   Renne Crigler, PA-C      Allergies    Patient has no known allergies.    Review of Systems   Review of Systems  HENT:  Positive for dental problem.   Genitourinary:  Positive for dysuria.  All other systems reviewed and are negative.   Physical Exam Updated Vital Signs BP 133/79 (BP Location: Right Arm)   Pulse 67   Temp 99 F (37.2 C) (Oral)   Resp 16   Ht 5\' 5"  (1.651 m)   Wt 59 kg   LMP 10/12/2022 (Approximate)   SpO2 100%   BMI 21.63 kg/m  Physical Exam Vitals and nursing note reviewed.  Constitutional:      Appearance: Normal appearance.  HENT:     Head: Normocephalic and atraumatic.     Mouth/Throat:     Mouth: Mucous membranes are moist.     Comments: Poor dentition, tooth 26 is chipped Eyes:     Conjunctiva/sclera: Conjunctivae normal.     Pupils: Pupils are equal, round, and reactive to light.  Cardiovascular:     Rate and Rhythm: Normal rate and regular rhythm.     Pulses: Normal pulses.     Heart sounds: Normal heart sounds.  Pulmonary:     Effort: Pulmonary effort is normal.     Breath sounds: Normal breath  sounds.  Abdominal:     Palpations: Abdomen is soft.     Tenderness: There is no abdominal tenderness. There is no right CVA tenderness or left CVA tenderness.  Skin:    General: Skin is warm and dry.     Findings: No rash.  Neurological:     General: No focal deficit present.     Mental Status: She is alert.  Psychiatric:        Mood and Affect: Mood normal.        Behavior: Behavior normal.     ED Results / Procedures / Treatments   Labs (all labs ordered are listed, but only abnormal results are displayed) Labs Reviewed  URINALYSIS, ROUTINE W REFLEX MICROSCOPIC - Abnormal; Notable for the following components:      Result Value   Leukocytes,Ua SMALL (*)    Bacteria, UA RARE (*)    All other components within normal limits     EKG None  Radiology No results found.  Procedures Procedures: not indicated.    Medications Ordered in ED Medications - No data to display  ED Course/ Medical Decision Making/ A&P                             Medical Decision Making Amount and/or Complexity of Data Reviewed Labs: ordered.   Patient to the ED today for evaluation of dental pain and dysuria.  Patient has poor dentition.  Tooth #26 is chipped and has associated surrounding gum tenderness. My differential diagnoses include: pulpitis vs dental abscess vs gingivitis, cystitis vs pyelonephritis. Vitals are stable. U/A today for leukocyte esterase and some bacteria. Patient is safe for discharge home. Prescribed Keflex and Flagyl for treatment of probable dental infection and UTI. Instructed to follow up with dentist in 1 week. Return precautions given.         Final Clinical Impression(s) / ED Diagnoses Final diagnoses:  Pain, dental  Acute cystitis without hematuria    Rx / DC Orders ED Discharge Orders          Ordered    cephALEXin (KEFLEX) 500 MG capsule  3 times daily        10/19/22 1835    metroNIDAZOLE (FLAGYL) 500 MG tablet  2 times daily        10/19/22 1835              Maxwell Marion, PA-C 10/19/22 1854    Derwood Kaplan, MD 10/19/22 2014
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
## 2022-11-02 IMAGING — CT CT MAXILLOFACIAL W/ CM
3 of 6 series · 15 of 47 positions shown, 18 images · IV contrast (APPLIED)
Comparison: None Available.

CLINICAL DATA: Swelling, right-sided facial tenderness

EXAM:
CT MAXILLOFACIAL WITH CONTRAST
TECHNIQUE: Multidetector CT imaging of the maxillofacial structures was
performed with intravenous contrast. Multiplanar CT image
reconstructions were also generated.

[Series 3: maxilllofacial 2.0 hr40 3 · axial · 0.36mm/px · z∈[-137,+11]mm · 10 of 88 slices shown, 13 images]
[im 7/88  brain]
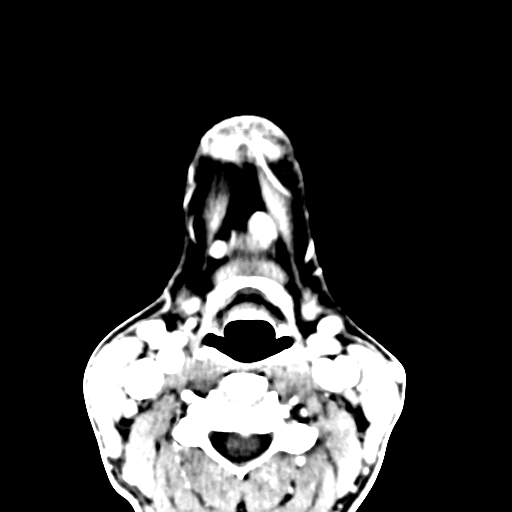
[im 7/88  bone]
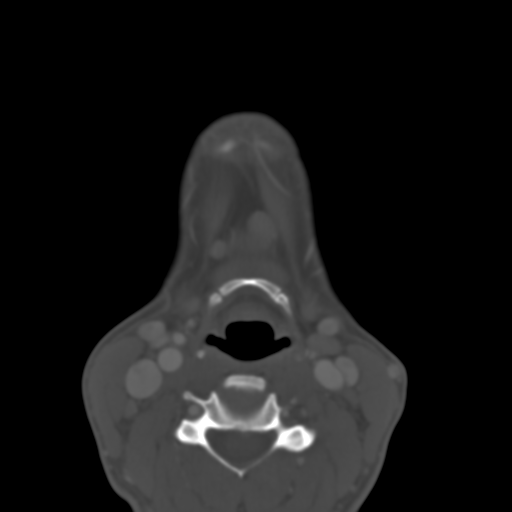
[im 13/88  bone]
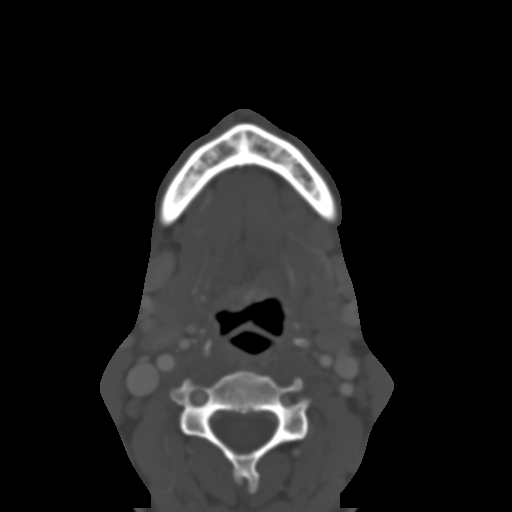
[im 25/88  bone]
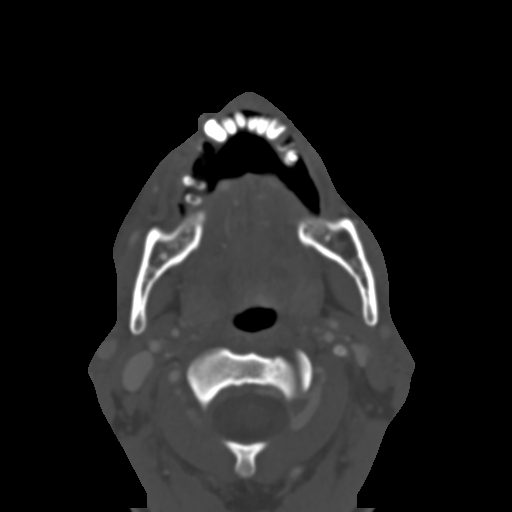
[im 32/88  bone]
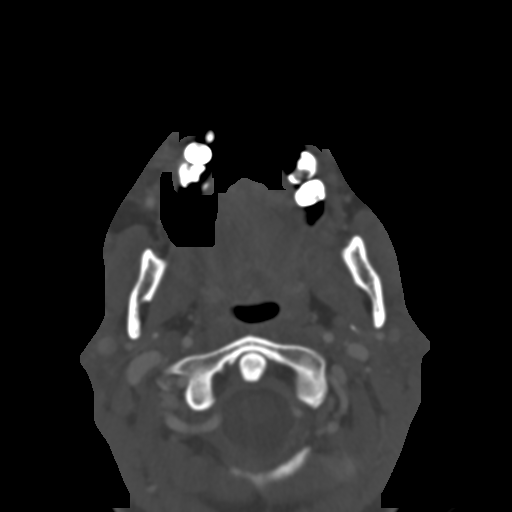
[im 38/88  brain]
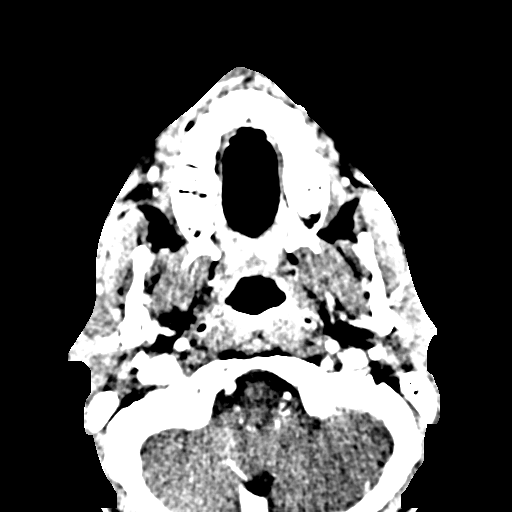
[im 38/88  bone]
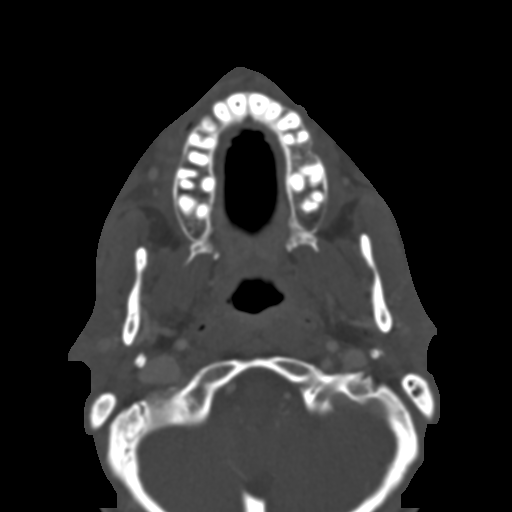
[im 50/88  bone]
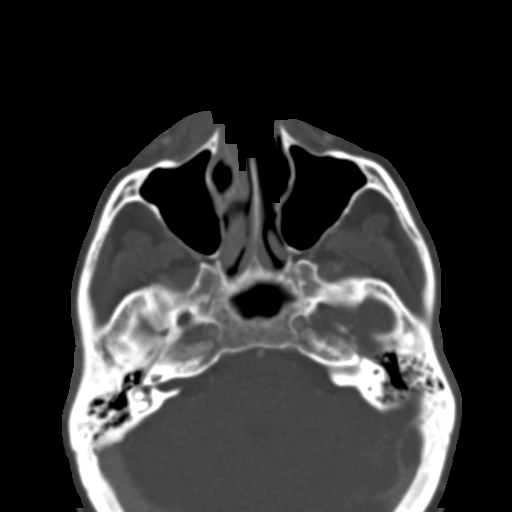
[im 56/88  bone]
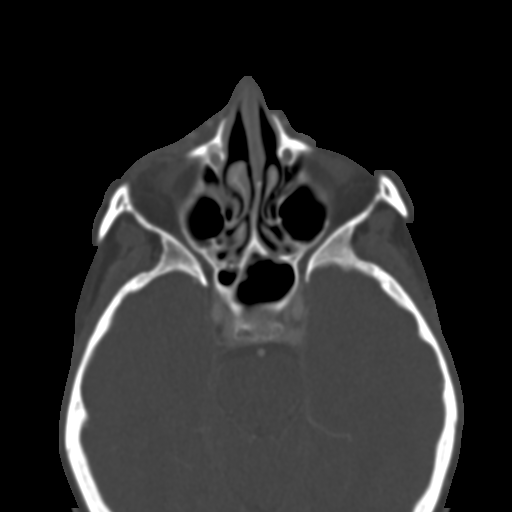
[im 63/88  bone]
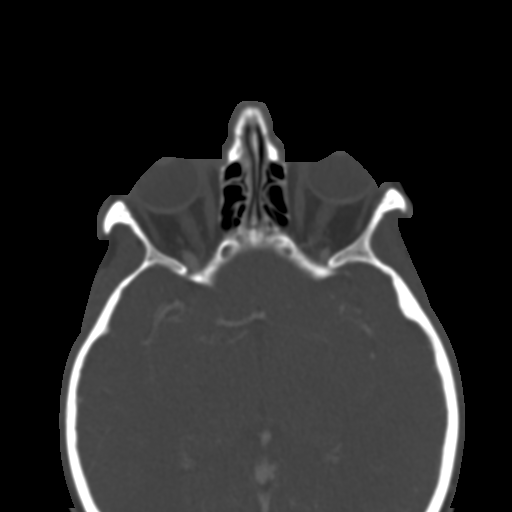
[im 75/88  brain]
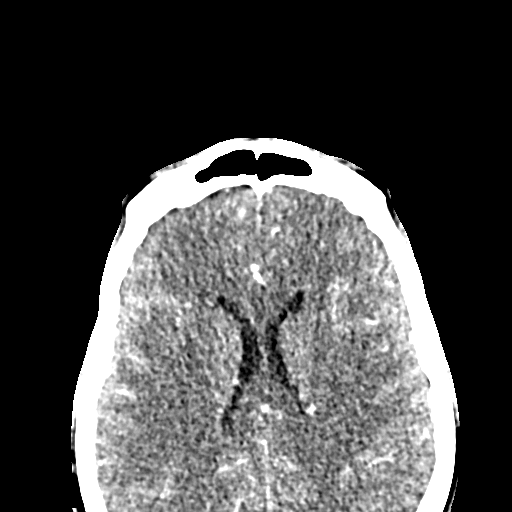
[im 75/88  bone]
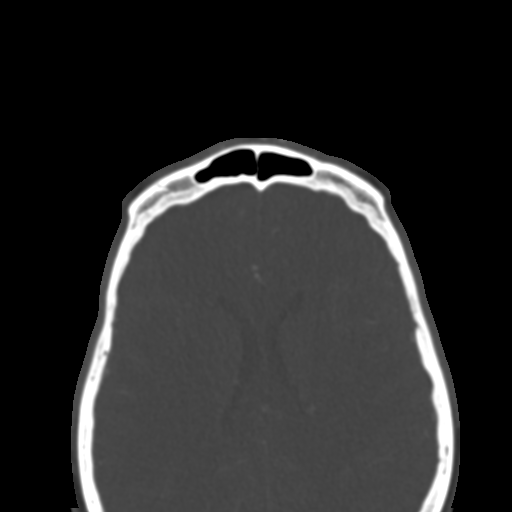
[im 81/88  bone]
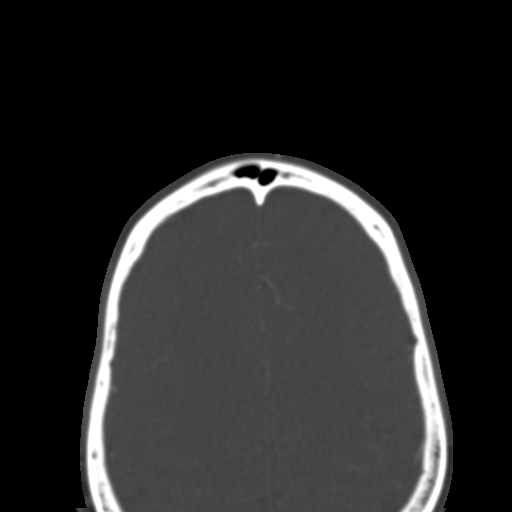

[Series 7: st cor · coronal · 0.32mm/px · 3 of 111 slices shown]
[im 28/111  bone]
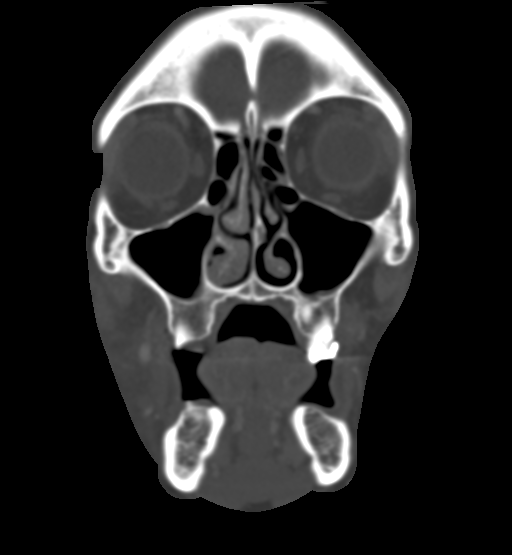
[im 56/111  bone]
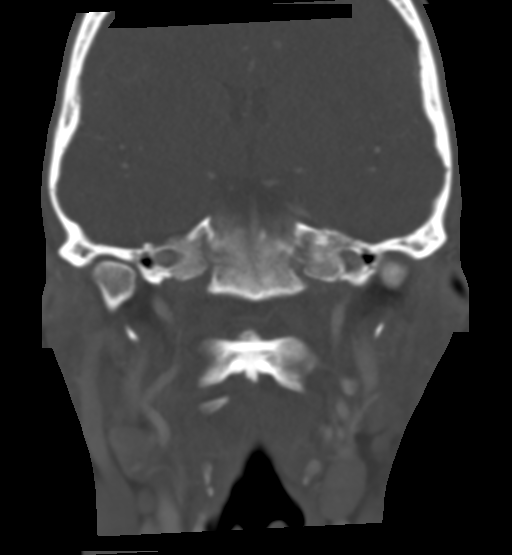
[im 83/111  bone]
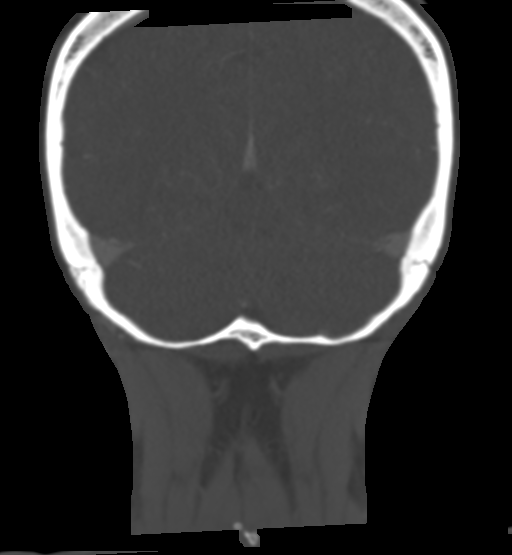

[Series 10: bone sag · sagittal · 0.35mm/px · 2 of 84 slices shown]
[im 28/84  bone]
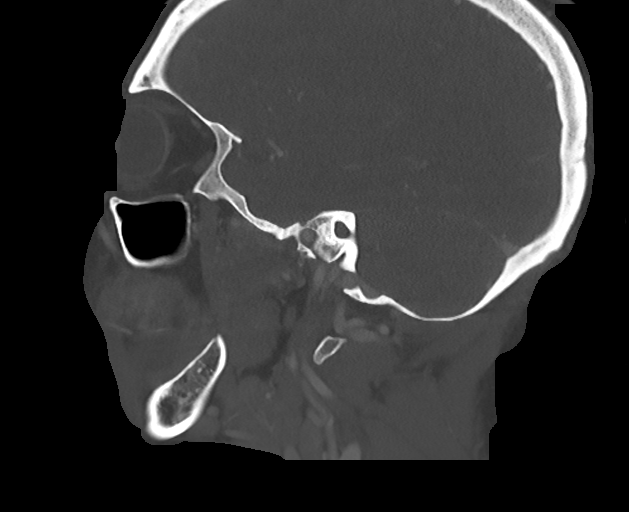
[im 56/84  bone]
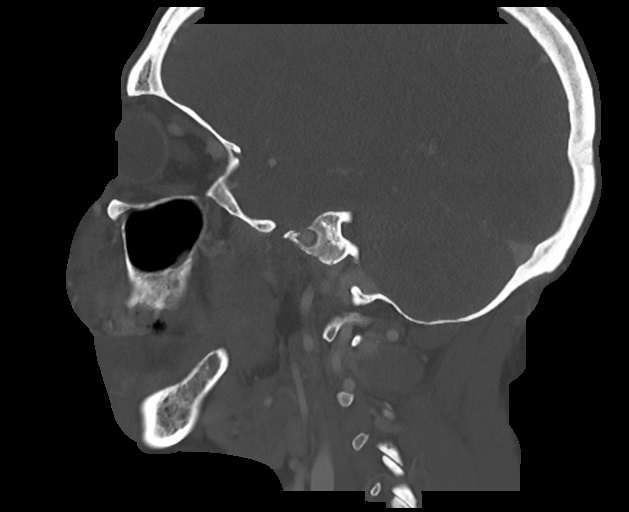

[15 of 47 positions shown; findings below may reference images not displayed]

RADIATION DOSE REDUCTION: This exam was performed according to the
departmental dose-optimization program which includes automated
exposure control, adjustment of the mA and/or kV according to
patient size and/or use of iterative reconstruction technique.

CONTRAST:  100mL OMNIPAQUE IOHEXOL 300 MG/ML  SOLN
FINDINGS: Osseous: No fracture or mandibular dislocation. No destructive
process.

There is extremely poor dentition, with dental caries and erosive
changes seen within all visualized teeth. These are more pronounced
within the lower teeth compared to the upper teeth.

Orbits: Negative. No traumatic or inflammatory finding.

Sinuses: Clear.

Soft tissues: Mild soft tissue swelling along these ule bridge
extending in the right infraorbital region and along the upper lip.
No fluid collection or abscess.

Limited intracranial: No significant or unexpected finding.
IMPRESSION: 1. Soft tissue swelling of the nasal bridge extending into the right
infraorbital region and along the upper lip. No fluid collection or
abscess.
2. Extremely poor dentition, with diffuse dental caries and erosive
changes within all visualized teeth.
3. No acute bony abnormality.

## 2024-03-13 ENCOUNTER — Emergency Department (HOSPITAL_COMMUNITY): Payer: Self-pay

## 2024-03-13 ENCOUNTER — Other Ambulatory Visit: Payer: Self-pay

## 2024-03-13 ENCOUNTER — Inpatient Hospital Stay (HOSPITAL_COMMUNITY)
Admission: EM | Admit: 2024-03-13 | Discharge: 2024-03-26 | DRG: 868 | Disposition: A | Discharge status: Home / Self Care | Payer: Self-pay | Attending: Internal Medicine | Admitting: Internal Medicine

## 2024-03-13 ENCOUNTER — Encounter (HOSPITAL_COMMUNITY): Payer: Self-pay

## 2024-03-13 DIAGNOSIS — F17210 Nicotine dependence, cigarettes, uncomplicated: Secondary | ICD-10-CM | POA: Diagnosis present

## 2024-03-13 DIAGNOSIS — N9489 Other specified conditions associated with female genital organs and menstrual cycle: Secondary | ICD-10-CM | POA: Diagnosis present

## 2024-03-13 DIAGNOSIS — A5149 Other secondary syphilitic conditions: Principal | ICD-10-CM | POA: Diagnosis present

## 2024-03-13 DIAGNOSIS — A6000 Herpesviral infection of urogenital system, unspecified: Secondary | ICD-10-CM | POA: Diagnosis present

## 2024-03-13 DIAGNOSIS — F1910 Other psychoactive substance abuse, uncomplicated: Secondary | ICD-10-CM | POA: Diagnosis present

## 2024-03-13 DIAGNOSIS — Z720 Tobacco use: Secondary | ICD-10-CM | POA: Diagnosis present

## 2024-03-13 DIAGNOSIS — Z79899 Other long term (current) drug therapy: Secondary | ICD-10-CM

## 2024-03-13 DIAGNOSIS — F419 Anxiety disorder, unspecified: Secondary | ICD-10-CM | POA: Diagnosis present

## 2024-03-13 DIAGNOSIS — D72829 Elevated white blood cell count, unspecified: Secondary | ICD-10-CM | POA: Diagnosis present

## 2024-03-13 DIAGNOSIS — F319 Bipolar disorder, unspecified: Secondary | ICD-10-CM | POA: Diagnosis present

## 2024-03-13 DIAGNOSIS — Z5900 Homelessness unspecified: Secondary | ICD-10-CM

## 2024-03-13 DIAGNOSIS — R109 Unspecified abdominal pain: Secondary | ICD-10-CM | POA: Diagnosis present

## 2024-03-13 DIAGNOSIS — R590 Localized enlarged lymph nodes: Secondary | ICD-10-CM | POA: Diagnosis present

## 2024-03-13 DIAGNOSIS — Z23 Encounter for immunization: Secondary | ICD-10-CM

## 2024-03-13 DIAGNOSIS — F603 Borderline personality disorder: Secondary | ICD-10-CM | POA: Diagnosis present

## 2024-03-13 DIAGNOSIS — A523 Neurosyphilis, unspecified: Secondary | ICD-10-CM

## 2024-03-13 DIAGNOSIS — R102 Pelvic and perineal pain unspecified side: Principal | ICD-10-CM

## 2024-03-13 DIAGNOSIS — Z2981 Encounter for HIV pre-exposure prophylaxis: Secondary | ICD-10-CM

## 2024-03-13 DIAGNOSIS — F4310 Post-traumatic stress disorder, unspecified: Secondary | ICD-10-CM | POA: Diagnosis present

## 2024-03-13 DIAGNOSIS — R21 Rash and other nonspecific skin eruption: Secondary | ICD-10-CM | POA: Diagnosis present

## 2024-03-13 DIAGNOSIS — E876 Hypokalemia: Secondary | ICD-10-CM | POA: Diagnosis present

## 2024-03-13 DIAGNOSIS — J45909 Unspecified asthma, uncomplicated: Secondary | ICD-10-CM | POA: Diagnosis present

## 2024-03-13 DIAGNOSIS — A539 Syphilis, unspecified: Secondary | ICD-10-CM

## 2024-03-13 LAB — URINALYSIS, ROUTINE W REFLEX MICROSCOPIC
Bilirubin Urine: NEGATIVE
Glucose, UA: NEGATIVE mg/dL
Hgb urine dipstick: NEGATIVE
Ketones, ur: NEGATIVE mg/dL
Nitrite: NEGATIVE
Protein, ur: NEGATIVE mg/dL
Specific Gravity, Urine: 1.026 (ref 1.005–1.030)
pH: 5 (ref 5.0–8.0)

## 2024-03-13 LAB — COMPREHENSIVE METABOLIC PANEL WITH GFR
ALT: 7 U/L (ref 0–44)
AST: 16 U/L (ref 15–41)
Albumin: 3.8 g/dL (ref 3.5–5.0)
Alkaline Phosphatase: 73 U/L (ref 38–126)
Anion gap: 8 (ref 5–15)
BUN: 12 mg/dL (ref 6–20)
CO2: 26 mmol/L (ref 22–32)
Calcium: 9.2 mg/dL (ref 8.9–10.3)
Chloride: 105 mmol/L (ref 98–111)
Creatinine, Ser: 0.72 mg/dL (ref 0.44–1.00)
GFR, Estimated: >60 mL/min (ref >60)
Glucose, Bld: 74 mg/dL (ref 70–99)
Potassium: 4 mmol/L (ref 3.5–5.1)
Sodium: 139 mmol/L (ref 135–145)
Total Bilirubin: 0.2 mg/dL (ref 0.0–1.2)
Total Protein: 6.6 g/dL (ref 6.5–8.1)

## 2024-03-13 LAB — CBC WITH DIFFERENTIAL/PLATELET
Abs Immature Granulocytes: 0.04 K/uL (ref 0.00–0.07)
Basophils Absolute: 0.1 K/uL (ref 0.0–0.1)
Basophils Relative: 0 %
Eosinophils Absolute: 0.1 K/uL (ref 0.0–0.5)
Eosinophils Relative: 1 %
HCT: 42.3 % (ref 36.0–46.0)
Hemoglobin: 13.2 g/dL (ref 12.0–15.0)
Immature Granulocytes: 0 %
Lymphocytes Relative: 10 %
Lymphs Abs: 1.3 K/uL (ref 0.7–4.0)
MCH: 29.3 pg (ref 26.0–34.0)
MCHC: 31.2 g/dL (ref 30.0–36.0)
MCV: 94 fL (ref 80.0–100.0)
Monocytes Absolute: 1 K/uL (ref 0.1–1.0)
Monocytes Relative: 7 %
Neutro Abs: 10.8 K/uL — ABNORMAL HIGH (ref 1.7–7.7)
Neutrophils Relative %: 82 %
Platelets: 279 K/uL (ref 150–400)
RBC: 4.5 MIL/uL (ref 3.87–5.11)
RDW: 13 % (ref 11.5–15.5)
WBC: 13.2 K/uL — ABNORMAL HIGH (ref 4.0–10.5)
nRBC: 0 % (ref 0.0–0.2)

## 2024-03-13 LAB — WET PREP, GENITAL
Clue Cells Wet Prep HPF POC: NONE SEEN
Sperm: NONE SEEN
Trich, Wet Prep: NONE SEEN
WBC, Wet Prep HPF POC: <10 (ref <10)
Yeast Wet Prep HPF POC: NONE SEEN

## 2024-03-13 LAB — HCG, SERUM, QUALITATIVE: Preg, Serum: NEGATIVE

## 2024-03-13 MED ORDER — FOSFOMYCIN TROMETHAMINE 3 G PO PACK
3.0000 g | PACK | Freq: Once | ORAL | Status: AC
  Administered 2024-03-13: 3 g via ORAL
  Filled 2024-03-13: qty 3

## 2024-03-13 MED ORDER — IOHEXOL 300 MG/ML  SOLN
100.0000 mL | Freq: Once | INTRAMUSCULAR | Status: AC | PRN
  Administered 2024-03-13: 100 mL via INTRAVENOUS
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
## 2024-03-13 NOTE — ED Triage Notes (Signed)
 Patient to ED by EMS from home with c/o rash x3 days ABD pain on left side, fever, headache and swollen ovary on left side reports HX of Syphilis has had unprotected sex.

## 2024-03-13 NOTE — ED Notes (Signed)
 Patient transported to CT

## 2024-03-14 ENCOUNTER — Encounter (HOSPITAL_COMMUNITY): Payer: Self-pay | Admitting: Internal Medicine

## 2024-03-14 DIAGNOSIS — R109 Unspecified abdominal pain: Secondary | ICD-10-CM | POA: Diagnosis present

## 2024-03-14 DIAGNOSIS — A5149 Other secondary syphilitic conditions: Secondary | ICD-10-CM

## 2024-03-14 DIAGNOSIS — Z720 Tobacco use: Secondary | ICD-10-CM | POA: Diagnosis present

## 2024-03-14 DIAGNOSIS — A6000 Herpesviral infection of urogenital system, unspecified: Secondary | ICD-10-CM

## 2024-03-14 DIAGNOSIS — F319 Bipolar disorder, unspecified: Secondary | ICD-10-CM | POA: Diagnosis present

## 2024-03-14 DIAGNOSIS — N9489 Other specified conditions associated with female genital organs and menstrual cycle: Secondary | ICD-10-CM | POA: Diagnosis present

## 2024-03-14 DIAGNOSIS — F1910 Other psychoactive substance abuse, uncomplicated: Secondary | ICD-10-CM | POA: Diagnosis present

## 2024-03-14 DIAGNOSIS — R21 Rash and other nonspecific skin eruption: Secondary | ICD-10-CM | POA: Diagnosis present

## 2024-03-14 DIAGNOSIS — R590 Localized enlarged lymph nodes: Secondary | ICD-10-CM | POA: Diagnosis present

## 2024-03-14 DIAGNOSIS — E876 Hypokalemia: Secondary | ICD-10-CM | POA: Diagnosis present

## 2024-03-14 LAB — COMPREHENSIVE METABOLIC PANEL WITH GFR
ALT: 9 U/L (ref 0–44)
AST: 17 U/L (ref 15–41)
Albumin: 3.7 g/dL (ref 3.5–5.0)
Alkaline Phosphatase: 62 U/L (ref 38–126)
Anion gap: 8 (ref 5–15)
BUN: 11 mg/dL (ref 6–20)
CO2: 27 mmol/L (ref 22–32)
Calcium: 9 mg/dL (ref 8.9–10.3)
Chloride: 105 mmol/L (ref 98–111)
Creatinine, Ser: 0.81 mg/dL (ref 0.44–1.00)
GFR, Estimated: >60 mL/min (ref >60)
Glucose, Bld: 128 mg/dL — ABNORMAL HIGH (ref 70–99)
Potassium: 3.4 mmol/L — ABNORMAL LOW (ref 3.5–5.1)
Sodium: 140 mmol/L (ref 135–145)
Total Bilirubin: <0.2 mg/dL (ref 0.0–1.2)
Total Protein: 6.7 g/dL (ref 6.5–8.1)

## 2024-03-14 LAB — GC/CHLAMYDIA PROBE AMP (~~LOC~~) NOT AT ARMC
Chlamydia: NEGATIVE
Comment: NEGATIVE
Comment: NORMAL
Neisseria Gonorrhea: NEGATIVE

## 2024-03-14 LAB — CBC WITH DIFFERENTIAL/PLATELET
Abs Immature Granulocytes: 0.04 K/uL (ref 0.00–0.07)
Basophils Absolute: 0 K/uL (ref 0.0–0.1)
Basophils Relative: 0 %
Eosinophils Absolute: 0.1 K/uL (ref 0.0–0.5)
Eosinophils Relative: 1 %
HCT: 44.2 % (ref 36.0–46.0)
Hemoglobin: 13.9 g/dL (ref 12.0–15.0)
Immature Granulocytes: 0 %
Lymphocytes Relative: 14 %
Lymphs Abs: 1.4 K/uL (ref 0.7–4.0)
MCH: 29.5 pg (ref 26.0–34.0)
MCHC: 31.4 g/dL (ref 30.0–36.0)
MCV: 93.8 fL (ref 80.0–100.0)
Monocytes Absolute: 0.6 K/uL (ref 0.1–1.0)
Monocytes Relative: 6 %
Neutro Abs: 7.9 K/uL — ABNORMAL HIGH (ref 1.7–7.7)
Neutrophils Relative %: 79 %
Platelets: 248 K/uL (ref 150–400)
RBC: 4.71 MIL/uL (ref 3.87–5.11)
RDW: 13.1 % (ref 11.5–15.5)
WBC: 10 K/uL (ref 4.0–10.5)
nRBC: 0 % (ref 0.0–0.2)

## 2024-03-14 LAB — RPR
RPR Ser Ql: REACTIVE — AB
RPR Titer: >=1:256 {titer}

## 2024-03-14 LAB — HIV ANTIBODY (ROUTINE TESTING W REFLEX): HIV Screen 4th Generation wRfx: NONREACTIVE

## 2024-03-14 LAB — MAGNESIUM: Magnesium: 2.2 mg/dL (ref 1.7–2.4)

## 2024-03-14 MED ORDER — INFLUENZA VIRUS VACC SPLIT PF (FLUZONE) 0.5 ML IM SUSY
0.5000 mL | PREFILLED_SYRINGE | INTRAMUSCULAR | Status: AC
Start: 2024-03-15 — End: 2024-03-17
  Administered 2024-03-17: 0.5 mL via INTRAMUSCULAR
  Filled 2024-03-14 (×3): qty 0.5

## 2024-03-14 MED ORDER — SODIUM CHLORIDE 0.9 % IV SOLN
1.0000 g | Freq: Once | INTRAVENOUS | Status: AC
  Administered 2024-03-14: 1 g via INTRAVENOUS
  Filled 2024-03-14: qty 10

## 2024-03-14 MED ORDER — KETOROLAC TROMETHAMINE 30 MG/ML IJ SOLN
30.0000 mg | Freq: Once | INTRAMUSCULAR | Status: AC
  Administered 2024-03-14: 30 mg via INTRAVENOUS
  Filled 2024-03-14: qty 1

## 2024-03-14 MED ORDER — OXYCODONE-ACETAMINOPHEN 5-325 MG PO TABS
1.0000 | ORAL_TABLET | Freq: Four times a day (QID) | ORAL | Status: DC | PRN
Start: 2024-03-14 — End: 2024-03-26
  Administered 2024-03-14 – 2024-03-26 (×13): 1 via ORAL
  Filled 2024-03-14 (×14): qty 1

## 2024-03-14 MED ORDER — PNEUMOCOCCAL 20-VAL CONJ VACC 0.5 ML IM SUSY
0.5000 mL | PREFILLED_SYRINGE | INTRAMUSCULAR | Status: AC
  Administered 2024-03-16: 0.5 mL via INTRAMUSCULAR
  Filled 2024-03-14: qty 0.5

## 2024-03-14 MED ORDER — POTASSIUM CHLORIDE CRYS ER 20 MEQ PO TBCR
40.0000 meq | EXTENDED_RELEASE_TABLET | Freq: Once | ORAL | Status: AC
  Administered 2024-03-14: 40 meq via ORAL
  Filled 2024-03-14: qty 2

## 2024-03-14 MED ORDER — ONDANSETRON HCL 4 MG/2ML IJ SOLN
4.0000 mg | Freq: Four times a day (QID) | INTRAMUSCULAR | Status: DC | PRN
  Administered 2024-03-16 – 2024-03-26 (×7): 4 mg via INTRAVENOUS
  Filled 2024-03-14 (×7): qty 2

## 2024-03-14 MED ORDER — DOXYCYCLINE HYCLATE 100 MG PO TABS
100.0000 mg | ORAL_TABLET | Freq: Two times a day (BID) | ORAL | Status: DC
  Administered 2024-03-14 – 2024-03-16 (×5): 100 mg via ORAL
  Filled 2024-03-14 (×5): qty 1

## 2024-03-14 MED ORDER — ACETAMINOPHEN 325 MG PO TABS: 650.0000 mg | ORAL_TABLET | Freq: Four times a day (QID) | ORAL | Status: DC | PRN

## 2024-03-14 MED ORDER — SODIUM CHLORIDE 0.9 % IV SOLN
1.0000 g | INTRAVENOUS | Status: DC
  Administered 2024-03-14 – 2024-03-16 (×3): 1 g via INTRAVENOUS
  Filled 2024-03-14 (×3): qty 10

## 2024-03-14 MED ORDER — VALACYCLOVIR HCL 500 MG PO TABS
1000.0000 mg | ORAL_TABLET | Freq: Two times a day (BID) | ORAL | Status: AC
  Administered 2024-03-14 – 2024-03-21 (×17): 1000 mg via ORAL
  Filled 2024-03-14 (×17): qty 2

## 2024-03-14 MED ORDER — KETOROLAC TROMETHAMINE 15 MG/ML IJ SOLN: 15.0000 mg | Freq: Four times a day (QID) | INTRAMUSCULAR | Status: AC | PRN

## 2024-03-14 MED ORDER — CLOTRIMAZOLE 1 % EX CREA
TOPICAL_CREAM | Freq: Two times a day (BID) | CUTANEOUS | Status: DC
Start: 2024-03-14 — End: 2024-03-26
  Administered 2024-03-14 – 2024-03-24 (×9): 1 via TOPICAL
  Filled 2024-03-14 (×4): qty 15

## 2024-03-14 MED ORDER — MELATONIN 3 MG PO TABS
3.0000 mg | ORAL_TABLET | Freq: Every evening | ORAL | Status: DC | PRN
  Administered 2024-03-14 – 2024-03-24 (×10): 3 mg via ORAL
  Filled 2024-03-14 (×10): qty 1

## 2024-03-14 MED ORDER — ACETAMINOPHEN 650 MG RE SUPP: 650.0000 mg | Freq: Four times a day (QID) | RECTAL | Status: DC | PRN

## 2024-03-14 NOTE — Progress Notes (Signed)
  Carryover admission to the Day Admitter.  I discussed this case with the EDP,  Lavanda Lesches, PA.  Per these discussions:   This is a 43 year old female with history of syphilis, status posttreatment, who is being admitted with left lower quadrant abdominal discomfort after presenting with complaint of left lower quadrant abdominal discomfort over the last 3 days along with labial discomfort associated with ulcerative lesions.  Appearance of the latter was reported to be concerning for HSV, with HSV culture and typing currently pending.  She received a dose of Valtrex.  In the ED, she also underwent wet prep, which was negative.  GC and chlamydia are pending. Beta-hCG was negative. RPR pending.  Urinalysis was reported to be suggestive of UTI, prompting ministration of fosfomycin.   CT abdomen/pelvis with contrast showed evidence of bilateral pelvic lymphadenopathy, including necrotic appearing inguinal lymph nodes, felt to be reactive versus infectious.  CT abdomen/pelvis otherwise, showed no evidence of acute process, including no evidence of abscess or overt radiographic evidence of PID.   I have placed an order for  obs to med-tele for further evaluation management of the above.  I have placed some additional preliminary admit orders via the adult multi-morbid admission order set. I have also ordered prn Percocet, prn IV Toradol , as well as morning labs that include CMP, CBC, magnesium level.  I will defer decision making regarding antibiotics versus antiviral medications to the admitting hospitalist.    Eva Pore, DO Hospitalist

## 2024-03-14 NOTE — Consult Note (Addendum)
 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 female with history of astham, bipolar, bordeline and PTSD, prior hx of syphilis who was admitted from rash, starting to have painful swelling in groin bilaterally, genital ulcers, and discharge. She broke up with her partner roughly 2 weeks ago. Her symptoms started about that time  Initially on legs, torso. Non pruritic.   ROS: reports headache with some blurry vision which is new  Past Medical History:  Diagnosis Date   Asthma    Bipolar 1 disorder (HCC)    Borderline personality disorder (HCC)    PTSD (post-traumatic stress disorder)    Shingles     Allergies: No Known Allergies  Current antibiotics:   MEDICATIONS:  doxycycline  100 mg Oral Q12H   valACYclovir  1,000 mg Oral BID    Social History   Tobacco Use   Smoking status: Every Day    Types: Cigarettes   Smokeless tobacco: Never  Vaping Use   Vaping status: Never Used  Substance Use Topics   Alcohol use: Yes   Drug use: Yes    Types: Marijuana, Cocaine    History reviewed. No pertinent family history.  Review of Systems -  12 point ros is negative except what is mentioned above  Social history - housing displaced  OBJECTIVE: Temp:  [98.2 F (36.8 C)-98.9 F (37.2 C)] 98.9 F (37.2 C) (10/17 1538) Pulse Rate:  [52-77] 72 (10/17 1538) Resp:  [15-27] 20 (10/17 1538) BP: (121-155)/(66-112) 137/90 (10/17 1538) SpO2:  [94 %-100 %] 99 % (10/17 1538) Weight:  [60 kg-62 kg] 62 kg (10/17 1538) Physical Exam  Constitutional:  oriented to person, place, and time. appears well-developed and well-nourished. No  distress.  HENT: [place]/AT, PERRLA, no scleral icterus Mouth/Throat: Oropharynx is clear and moist. No oropharyngeal exudate. Poor dentition Cardiovascular: Normal rate, regular rhythm and normal heart sounds. Exam reveals no gallop and no friction rub.  No murmur heard.  Pulmonary/Chest: Effort normal and breath sounds normal. No respiratory distress.  has no wheezes.  Neck = supple, no nuchal rigidity Abdominal: Soft. Bowel sounds are normal.  exhibits no distension. There is no tenderness.  Lymphadenopathy: +inguinal adenopathy Neurological: alert and oriented to person, place, and time.  Skin: Skin is warm and dry. Rash+ per media. Uniformly annular with central clearing on legs, torso, buttocks. Also + palmar rash Psychiatric: a normal mood and affect.  behavior is normal.    LABS: Results for orders placed or performed during the hospital encounter of 03/13/24 (from the past 48 hours)  GC/Chlamydia probe amp     Status: None   Collection Time: 03/13/24  8:43 PM  Result Value Ref Range   Neisseria Gonorrhea Negative    Chlamydia Negative    Comment Normal Reference Ranger Chlamydia - Negative    Comment      Normal Reference Range Neisseria Gonorrhea - Negative  RPR     Status: Abnormal   Collection Time: 03/13/24  8:43 PM  Result Value Ref Range   RPR Ser Ql Reactive (A) NON REACTIVE  Comment: SENT FOR CONFIRMATION   RPR Titer >=1:256     Comment: Performed at [facility], [address] [place], [place] [address]  Urinalysis, Routine w reflex microscopic -Urine, Clean Catch     Status: Abnormal   Collection Time: 03/13/24  8:43 PM  Result Value Ref Range   Color, Urine YELLOW YELLOW   APPearance CLOUDY (A) CLEAR   Specific Gravity, Urine 1.026 1.005 - 1.030   pH 5.0 5.0 - 8.0   Glucose, UA NEGATIVE NEGATIVE mg/dL   Hgb urine dipstick NEGATIVE NEGATIVE   Bilirubin Urine NEGATIVE NEGATIVE   Ketones, ur NEGATIVE NEGATIVE mg/dL   Protein, ur NEGATIVE NEGATIVE mg/dL    Nitrite NEGATIVE NEGATIVE   Leukocytes,Ua TRACE (A) NEGATIVE   RBC / HPF 0-5 0 - 5 RBC/hpf   WBC, UA 6-10 0 - 5 WBC/hpf   Bacteria, UA MANY (A) NONE SEEN   Squamous Epithelial / HPF 21-50 0 - 5 /HPF   Mucus PRESENT    Ca Oxalate Crys, UA PRESENT     Comment: Performed at [facility], [address], [place], [place] [address]  HIV Antibody (routine testing w rflx)     Status: None   Collection Time: 03/13/24  8:43 PM  Result Value Ref Range   HIV Screen 4th Generation wRfx Non Reactive Non Reactive    Comment: Performed at [facility], [address], [place], [place] [address]  hCG, serum, qualitative     Status: None   Collection Time: 03/13/24  8:43 PM  Result Value Ref Range   Preg, Serum NEGATIVE NEGATIVE    Comment:        THE SENSITIVITY OF THIS METHODOLOGY IS >10 mIU/mL. Performed at [facility], [address] [place], [place] [address]   Wet prep, genital     Status: None   Collection Time: 03/13/24  8:43 PM   Specimen: PATH Cytology Cervicovaginal Ancillary Only  Result Value Ref Range   Yeast Wet Prep HPF POC NONE SEEN NONE SEEN   Trich, Wet Prep NONE SEEN NONE SEEN   Clue Cells Wet Prep HPF POC NONE SEEN NONE SEEN   WBC, Wet Prep HPF POC <10 <10   Sperm NONE SEEN     Comment: Performed at [facility], [address] [place], [place] [address]  CBC with Differential     Status: Abnormal   Collection Time: 03/13/24  8:43 PM  Result Value Ref Range   WBC 13.2 (H) 4.0 - 10.5 K/uL   RBC 4.50 3.87 - 5.11 MIL/uL   Hemoglobin 13.2 12.0 - 15.0 g/dL   HCT 57.6 63.9 - 53.9 %   MCV 94.0 80.0 - 100.0 fL   MCH 29.3 26.0 - 34.0 pg   MCHC 31.2 30.0 - 36.0 g/dL   RDW 86.9 88.4 - 84.4 %   Platelets 279 150 - 400 K/uL   nRBC 0.0 0.0 - 0.2 %   Neutrophils Relative % 82 %   Neutro Abs 10.8 (H) 1.7 - 7.7 K/uL   Lymphocytes Relative 10 %   Lymphs Abs 1.3 0.7 - 4.0 K/uL   Monocytes Relative 7 %   Monocytes  Absolute 1.0 0.1 - 1.0 K/uL   Eosinophils Relative 1 %   Eosinophils Absolute 0.1 0.0 - 0.5 K/uL   Basophils Relative 0 %   Basophils Absolute 0.1 0.0 - 0.1 K/uL   Immature Granulocytes 0 %   Abs Immature  Granulocytes 0.04 0.00 - 0.07 K/uL    Comment: Performed at [facility], [address] [place], [place] [address]  Comprehensive metabolic panel     Status: None   Collection Time: 03/13/24  8:43 PM  Result Value Ref Range   Sodium 139 135 - 145 mmol/L   Potassium 4.0 3.5 - 5.1 mmol/L   Chloride 105 98 - 111 mmol/L   CO2 26 22 - 32 mmol/L   Glucose, Bld 74 70 - 99 mg/dL    Comment: Glucose reference range applies only to samples taken after fasting for at least 8 hours.   BUN 12 6 - 20 mg/dL   Creatinine, Ser 9.27 0.44 - 1.00 mg/dL   Calcium 9.2 8.9 - 89.6 mg/dL   Total Protein 6.6 6.5 - 8.1 g/dL   Albumin 3.8 3.5 - 5.0 g/dL   AST 16 15 - 41 U/L   ALT 7 0 - 44 U/L   Alkaline Phosphatase 73 38 - 126 U/L   Total Bilirubin 0.2 0.0 - 1.2 mg/dL   GFR, Estimated >39 >39 mL/min    Comment: (NOTE) Calculated using the CKD-EPI Creatinine Equation (2021)    Anion gap 8 5 - 15    Comment: Performed at [facility], [address], [place], [place] [address]  CBC with Differential/Platelet     Status: Abnormal   Collection Time: 03/14/24  5:17 AM  Result Value Ref Range   WBC 10.0 4.0 - 10.5 K/uL   RBC 4.71 3.87 - 5.11 MIL/uL   Hemoglobin 13.9 12.0 - 15.0 g/dL   HCT 55.7 63.9 - 53.9 %   MCV 93.8 80.0 - 100.0 fL   MCH 29.5 26.0 - 34.0 pg   MCHC 31.4 30.0 - 36.0 g/dL   RDW 86.8 88.4 - 84.4 %   Platelets 248 150 - 400 K/uL   nRBC 0.0 0.0 - 0.2 %   Neutrophils Relative % 79 %   Neutro Abs 7.9 (H) 1.7 - 7.7 K/uL   Lymphocytes Relative 14 %   Lymphs Abs 1.4 0.7 - 4.0 K/uL   Monocytes Relative 6 %   Monocytes Absolute 0.6 0.1 - 1.0 K/uL   Eosinophils Relative 1 %   Eosinophils Absolute 0.1 0.0 - 0.5 K/uL   Basophils Relative 0 %   Basophils  Absolute 0.0 0.0 - 0.1 K/uL   Immature Granulocytes 0 %   Abs Immature Granulocytes 0.04 0.00 - 0.07 K/uL    Comment: Performed at [facility], [address], [place], [place] [address]  Comprehensive metabolic panel with GFR     Status: Abnormal   Collection Time: 03/14/24  5:17 AM  Result Value Ref Range   Sodium 140 135 - 145 mmol/L   Potassium 3.4 (L) 3.5 - 5.1 mmol/L   Chloride 105 98 - 111 mmol/L   CO2 27 22 - 32 mmol/L   Glucose, Bld 128 (H) 70 - 99 mg/dL    Comment: Glucose reference range applies only to samples taken after fasting for at least 8 hours.   BUN 11 6 - 20 mg/dL   Creatinine, Ser 9.18 0.44 - 1.00 mg/dL   Calcium 9.0 8.9 - 89.6 mg/dL   Total Protein 6.7 6.5 - 8.1 g/dL   Albumin 3.7 3.5 - 5.0 g/dL   AST 17 15 - 41 U/L   ALT 9 0 - 44 U/L   Alkaline Phosphatase 62 38 - 126 U/L   Total Bilirubin <0.2  0.0 - 1.2 mg/dL   GFR, Estimated >39 >39 mL/min    Comment: (NOTE) Calculated using the CKD-EPI Creatinine Equation (2021)    Anion gap 8 5 - 15    Comment: Performed at [facility], [address], [place], [place] [address]  Magnesium     Status: None   Collection Time: 03/14/24  5:17 AM  Result Value Ref Range   Magnesium 2.2 1.7 - 2.4 mg/dL    Comment: Performed at [facility], [address], [place], [place] [address]    MICRO:  IMAGING: CT ABDOMEN PELVIS W CONTRAST Result Date: 03/13/2024 EXAM: CT ABDOMEN AND PELVIS WITH CONTRAST 03/13/2024 11:28:51 PM TECHNIQUE: CT of the abdomen and pelvis was performed with the administration of 100 mL of iohexol  (OMNIPAQUE ) 300 MG/ML solution. Multiplanar reformatted images are provided for review. Automated exposure control, iterative reconstruction, and/or weight-based adjustment of the mA/kV was utilized to reduce the radiation dose to as low as reasonably achievable. COMPARISON: 07/05/2022 CLINICAL HISTORY: Abdominal pain, acute, nonlocalized;  Lymphadenopathy, groin. Per triage note: Patient to ED by EMS from home with c/o rash x3 days ABD pain on left side, fever, headache and swollen ovary on left side reports HX of Syphilis has had unprotected sex. FINDINGS: LOWER CHEST: No acute abnormality. LIVER: The liver is unremarkable. GALLBLADDER AND BILE DUCTS: Gallbladder is unremarkable. No biliary ductal dilatation. SPLEEN: No acute abnormality. PANCREAS: No acute abnormality. ADRENAL GLANDS: No acute abnormality. KIDNEYS, URETERS AND BLADDER: No stones in the kidneys or ureters. No hydronephrosis. No perinephric or periureteral stranding. Urinary bladder is unremarkable. GI AND BOWEL: Stomach demonstrates no acute abnormality. There is no bowel obstruction. Normal appendix (image 49). PERITONEUM AND RETROPERITONEUM: No ascites. No free air. VASCULATURE: Aorta is normal in caliber. LYMPH NODES: Bilateral pelvic lymphadenopathy, including necrotic inguinal nodes measuring 18 mm on the right and 20 mm on the left. Bilateral external iliac nodes measuring 10 mm on the right and 17 mm on the left. REPRODUCTIVE ORGANS: Uterus is within normal limits. BONES AND SOFT TISSUES: Mild degenerative changes of the lower lumbar spine. No acute osseous abnormality. No focal soft tissue abnormality. IMPRESSION: 1. Bilateral pelvic lymphadenopathy, including necrotic inguinal nodes, as above. This appearance is likely reactive/infectious, but warrants attention on clinical follow-up. If persistent, consider tissue sampling. 2. Otherwise, no acute findings. Electronically signed by: Pinkie Pebbles MD 03/13/2024 11:50 PM EDT RP Workstation: [id number]    HISTORICAL MICRO/IMAGING  Assessment/Plan:  59bn F with inguinal lymphadenopathy, vaginal discharge/lesions, and annular with central clearing rash that can be seen with secondary syphilis with + RPR 1:256+ Her complaints of seeing floaters/headache - make me concerned for neurosyphilis - start doxycycline 100mg  po  bid x 28 days but in the meantime would like for her to get LP, to check for cell count, and VDRL to decide if she needs IV PCN x 14d - she is HIV negative which is good, but could likely be a PrEP candidate in the future - lymphadenopathy - likely associated with syphilis/ can see with other STI. 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   I have personally spent 82 minutes involved in face-to-face and non-face-to-face activities for this patient on the day of the visit. Professional time spent includes the following activities: Preparing to see the patient (review of tests), Obtaining and/or reviewing separately obtained history (admission/discharge record), Performing a medically appropriate examination and/or evaluation , Ordering medications/tests/procedures, referring and communicating with other health care professionals, Documenting clinical information in the EMR, Independently interpreting results (not separately reported), Communicating results to the patient.   Montie FURY Luiz MD MPH [facility] [phone]

## 2024-03-14 NOTE — Plan of Care (Signed)
  Problem: Clinical Measurements: Goal: Respiratory complications will improve Outcome: Progressing Goal: Cardiovascular complication will be avoided Outcome: Progressing   Problem: Education: Goal: Knowledge of General Education information will improve Description: Including pain rating scale, medication(s)/side effects and non-pharmacologic comfort measures Outcome: Not Progressing   Problem: Health Behavior/Discharge Planning: Goal: Ability to manage health-related needs will improve Outcome: Not Progressing   Problem: Clinical Measurements: Goal: Ability to maintain clinical measurements within normal limits will improve Outcome: Not Progressing Goal: Will remain free from infection Outcome: Not Progressing Goal: Diagnostic test results will improve Outcome: Not Progressing   Problem: Activity: Goal: Risk for activity intolerance will decrease Outcome: Not Progressing

## 2024-03-14 NOTE — H&P (Signed)
 History and Physical    Patient: Barbara Maynard FMW:[id number] DOB: [birth year] DOA: 03/13/2024 DOS: the patient was seen and examined on 03/14/2024 PCP: Patient, No Pcp Per  Patient coming from: Home  Chief Complaint: Rash and abdominal pain.  HPI: Barbara Maynard is a 43 y.o. female with medical history significant of asthma, bipolar 1 disorder, borderline personality disorder, PTSD, herpes zoster, history of syphilis which has been treated who presented to the emergency department complaints of having a generalized rash, left lower quadrant pain for the past 3 days associated with genital ulcerative lesions concerning for HSV.  The patient offered limited story, limited participation in the physical exam and wanted me to rush through the HPI/PE because she wanted to go back to sleep.  She was responding yes and no to the same ROS questions.  She is not sure if she has been treated for syphilis or not.   Lab work: Urinalysis was cloudy with trace leukocyte esterase, 6-10 WBC and many bacteria.  CBC showed white count of 13.2, hemoglobin 13.2 g/dL platelets 720.  Serum pregnancy test is negative.  Normal wet prep.  CMP was unremarkable.  Imaging: CT abdomen/pelvis with contrast showing bilateral pelvic lymphadenopathy, including necrotic inguinal nodes, as above.  These are likely reactive/infection, but warrants attention on clinical follow-up consider tissue sampling if persistent.  There is otherwise no acute findings.   ED course: Initial vital signs were temp 98.8 F, pulse 70, respiration 18, BP 134/66 mmHg and O2 sat 9923 ceftriaxone 1 g IVPB, fosfomycin 3 g p.o. x 1, ketorolac  30 mg IVP, started on valacyclovir 1000 twice daily and Percocet 5/325 mg as needed.  Review of Systems: As mentioned in the history of present illness. All other systems reviewed and are negative. Past Medical History:  Diagnosis Date   Asthma    Bipolar 1 disorder (HCC)    Borderline personality disorder (HCC)     PTSD (post-traumatic stress disorder)    Shingles    Past Surgical History:  Procedure Laterality Date   CESAREAN SECTION     WISDOM TOOTH EXTRACTION     Social History:  reports that she has been smoking cigarettes. She has never used smokeless tobacco. She reports current alcohol use. She reports current drug use. Drugs: Marijuana and Cocaine.  No Known Allergies  History reviewed. No pertinent family history.  Prior to Admission medications   Medication Sig Start Date End Date Taking? Authorizing Provider  acetaminophen  (TYLENOL ) 500 MG tablet Take 1 tablet (500 mg total) by mouth every 6 (six) hours as needed. Patient not taking: Reported on 03/14/2024 09/04/17   Law, Alexandra M, PA-C  amoxicillin  (AMOXIL ) 500 MG capsule Take 1 capsule (500 mg total) by mouth 3 (three) times daily. Patient not taking: Reported on 03/14/2024 07/05/22   Desiderio Chew, PA-C  dicyclomine  (BENTYL ) 20 MG tablet Take 1 tablet (20 mg total) by mouth 2 (two) times daily. Patient not taking: Reported on 03/14/2024 07/05/22   Geiple, Joshua, PA-C  divalproex  (DEPAKOTE ) 500 MG DR tablet Take 1 tablet (500 mg total) by mouth every 12 (twelve) hours. Patient not taking: Reported on 03/14/2024 09/11/17   Sebastian Moles, MD  ibuprofen  (ADVIL ) 800 MG tablet Take 1 tablet (800 mg total) by mouth 3 (three) times daily. Patient not taking: Reported on 03/14/2024 03/02/19   Desiderio Chew, PA-C  methocarbamol  (ROBAXIN ) 500 MG tablet Take 1 tablet (500 mg total) by mouth 2 (two) times daily. Patient not taking: Reported on 09/09/2017 09/04/17  Law, Alexandra M, PA-C  ondansetron  (ZOFRAN -ODT) 4 MG disintegrating tablet Take 1 tablet (4 mg total) by mouth every 8 (eight) hours as needed for nausea or vomiting. Patient not taking: Reported on 03/14/2024 07/05/22   Desiderio Chew, PA-C    Physical Exam: Vitals:   03/14/24 0130 03/14/24 0131 03/14/24 0330 03/14/24 0530  BP: (!) 141/84  121/79 (!) 149/101  Pulse: 71  (!) 52  (!) 57  Resp: 16  17 (!) 23  Temp:  98.7 F (37.1 C)  98.2 F (36.8 C)  TempSrc:  Oral    SpO2: 97%  99% 98%  Weight:      Height:       Physical Exam Vitals reviewed.  Constitutional:      General: She is awake. She is not in acute distress.    Appearance: She is normal weight. She is ill-appearing.  HENT:     Head: Normocephalic.     Nose: No rhinorrhea.     Mouth/Throat:     Mouth: Mucous membranes are moist.  Eyes:     General: No scleral icterus.    Pupils: Pupils are equal, round, and reactive to light.  Cardiovascular:     Rate and Rhythm: Normal rate and regular rhythm.  Pulmonary:     Effort: Pulmonary effort is normal.  Abdominal:     General: Bowel sounds are normal. There is no distension.     Palpations: Abdomen is soft.  Musculoskeletal:     Cervical back: Neck supple.     Right lower leg: No edema.     Left lower leg: No edema.  Skin:    General: Skin is warm and dry.     Findings: Rash present.  Neurological:     General: No focal deficit present.     Mental Status: She is alert and oriented to person, place, and time.     Cranial Nerves: No cranial nerve deficit.     Sensory: No sensory deficit.  Psychiatric:     Comments: At times inattentive.        Data Reviewed:   Results are pending, will review when available.  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 (Has been bradycardic at times). Analgesics as needed. Antiemetics as needed. Continue valacyclovir 1000 mg p.o. twice daily. Continue ceftriaxone for cellulitis coverage. ID has been consulted.  Active Problems:   Rash in adult  Has history of syphilis. RPR titer is positive. Briefly discussed with ID. -Will follow their recommendations.    Bipolar 1 disorder (HCC) Currently not on therapy. Primary following with [facility]. No SI or HI at this  time. Patient to follow-up as an outpatient.    Polysubstance abuse (HCC) No signs of withdrawal at this time. Consider TOC team evaluation.    Tobacco use Declined nicotine replacement therapy. Smoking cessation would be beneficial.    Hypokalemia Replaced. Follow-up potassium level as needed.    Advance Care Planning:   Code Status: Full Code   Consults: Infectious diseases Wyman Bologna, MD).  Family Communication:   Severity of Illness: The appropriate patient status for this patient is OBSERVATION. Observation status is judged to be reasonable and necessary in order to provide the required intensity of service to ensure the patient's safety. The patient's presenting symptoms, physical exam findings, and initial radiographic and laboratory data in the context of their medical condition is felt  to place them at decreased risk for further clinical deterioration. Furthermore, it is anticipated that the patient will be medically stable for discharge from the hospital within 2 midnights of admission.   Author: Alm Dorn Castor, MD 03/14/2024 8:03 AM  For on call review [email or web address].   This document was prepared using Dragon voice recognition software and may contain some unintended transcription errors.

## 2024-03-15 ENCOUNTER — Observation Stay (HOSPITAL_COMMUNITY): Payer: Self-pay

## 2024-03-15 LAB — CSF CELL COUNT WITH DIFFERENTIAL
RBC Count, CSF: 7 /mm3 — ABNORMAL HIGH
Tube #: 4
WBC, CSF: 2 /mm3 (ref 0–5)

## 2024-03-15 LAB — GLUCOSE, CSF: Glucose, CSF: 56 mg/dL (ref 40–70)

## 2024-03-15 LAB — PROTEIN, CSF: Total  Protein, CSF: 54 mg/dL — ABNORMAL HIGH (ref 15–45)

## 2024-03-15 MED ORDER — PENICILLIN G POTASSIUM 20000000 UNITS IJ SOLR
24.0000 10*6.[IU] | INTRAVENOUS | Status: AC
  Administered 2024-03-15 – 2024-03-25 (×9): 24 10*6.[IU] via INTRAVENOUS
  Filled 2024-03-15: qty 24
  Filled 2024-03-15: qty 20
  Filled 2024-03-15 (×8): qty 24

## 2024-03-15 MED ORDER — POTASSIUM CHLORIDE CRYS ER 20 MEQ PO TBCR
20.0000 meq | EXTENDED_RELEASE_TABLET | Freq: Once | ORAL | Status: AC
  Administered 2024-03-15: 20 meq via ORAL
  Filled 2024-03-15: qty 1

## 2024-03-15 NOTE — Procedures (Signed)
 PROCEDURE SUMMARY:  Successful fluoro guided lumbar puncture at L4-L5.  Yielded 12 mL of clear, colorless fluid.  Opening pressure 14 mL H2O No immediate complications.  Pt tolerated well.   Specimen was sent for labs.  EBL < 5mL  Solmon Selmer Ku PA-C 03/15/2024 11:43 AM

## 2024-03-15 NOTE — Progress Notes (Signed)
 Patient educated earlier to lay flat till it's 4 hours after the procedure. She called to use the bathroom , before care could arrive, she already left for the restroom. More education reinforced to ensure patient is compliant. Side rails also raised. Will continue to monitor.

## 2024-03-15 NOTE — Progress Notes (Addendum)
 ID brief note   LP done, CSF sent for analysis including cell cell count, glucose, protein, gram stain, cultures, VDRL, cytology  Results are pending   Have asked micro to add CSF FTA-ABS, spoke with [facility]   5: 40 pm Addendum 10/16 CSF WBC 2, total protein 54 10/16 HIV is negative  CSF VDRL is pending  Per notes she had reported headache and floaters yesterday  I have asked micro to add CSF FTA-ABS as more sensitive test.  Will start pen G continuous infusion pending above, pharmacy consulted   Following peripherally over the weekend  Dr Fayette following starting 10/20  Barbara Upton, MD Infectious Disease Physician [facility] [address] [place], [place] [address] Phone: [phone]  Fax: [phone]

## 2024-03-15 NOTE — Hospital Course (Addendum)
 Barbara Maynard is a 43 y.o. 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  The patient offered limited stor,and is not sure if she has been treated for syphilis or not.  Labs: white count of 13.2, hemoglobin 13.2 g/dL platelets 720.  Serum pregnancy test is negative.  Normal wet prep.  CMP was unremarkable. ED course: Initial vital signs were temp 98.8 F, pulse 70, respiration 18, BP 134/66 mmHg and O2 sat 9923 ceftriaxone 1 g IVPB, fosfomycin 3 g p.o. x 1, ketorolac  30 mg IVP, started on valacyclovir 1000 twice daily and Percocet 5/325 mg as needed. Ct abd pelvis w/: Bilateral pelvic lymphadenopathy, including necrotic inguinal nodes, as above. This appearance is likely reactive/infectious, but warrants attention on clinical follow-up. If persistent, consider tissue sampling.  ID was consulted and admitted Patient underwent LP 10/18-and placed on IV penicillin pending VDRL and FTA-ABS antibody  Subjective: Seen and examined Overnight patient remains afebrile hemodynamically stable She feels improved, rash clearing up  Assessment and plan:  Shotty Lymphadenopathy Vaginal discharge/lesions Annular with central clearing rash: Presentation concerning for syphilis with genital lesions as well as skin lesions.  Patient has been having floaters show LP was done to rule out neurosyphilis-placed on IV penicillin pending further labs.  CSF studies no growth so far.  VDRL nonreactive, FTA-ABS pending If both come back positive we will treat as neurosyphilis otherwise not.  Treponema pallidum antibodies reactive She is homeless and cannot be discharged on IV antibiotics Continue doxycycline for other STI disease ID  following, discussed today  HSV genital ulcers: Continue Valtrex tid x 7-10 days  Polysubstance abuse Bipolar 1 disorder Tobacco abuse: Cessation counseled.TOC consulted.Not in treatment for BPD.  Psychiatry consulted, added trazodone bedtime. TOC consulted to address SDOH,and barriers And outpatient referrals.  Leukocytosis: Hypokalemia: Resolved  DVT prophylaxis: SCDs Start: 03/14/24 0044 Code Status:   Code Status: Full Code Family Communication: plan of care discussed with patient at bedside. Patient status is: Remains hospitalized because of severity of illness Level of care: Telemetry   Dispo: The patient is from: homeless            Anticipated disposition: Pending FTA-ABS and ID clearance  Objective: Vitals last 24 hrs: Vitals:   03/17/24 2323 03/18/24 1331 03/18/24 2216 03/19/24 0513  BP: 114/68 134/73 128/72 114/66  Pulse: (!) 57 64 71 64  Resp: 18 17 19 16   Temp: 98.7 F (37.1 C) 98.3 F (36.8 C) 98.4 F (36.9 C) 98.2 F (36.8 C)  TempSrc: Oral Oral Oral Oral  SpO2: 97% 97% 98% 98%  Weight:      Height:        Physical Examination: General exam: AAOX3, not much interactive, prefers to be on bed all the time HEENT:Oral mucosa moist, Ear/Nose WNL grossly Respiratory system: CTA b/l  Cardiovascular system: S1 & S2 +, No JVD. Gastrointestinal system: Abdomen soft,NT,ND, BS+ Nervous System: Alert, awake, moving all extremities,and following commands. Extremities: extremities warm, leg edema neg Skin: Warm, faint rashes + on arms/thigh MSK: Normal muscle bulk,tone, power   Medications reviewed:  Scheduled Meds:  clotrimazole   Topical BID   doxycycline  100 mg Oral Q12H   traZODone  50 mg Oral QHS,MR X 1   valACYclovir  1,000  mg Oral BID   Continuous Infusions:  penicillin G potassium 24 Million Units in dextrose 5 % 500 mL CONTINUOUS infusion 24 Million Units (03/19/24 0247)   Diet: Diet Order             Diet regular Room service appropriate?  Yes; Fluid consistency: Thin  Diet effective now

## 2024-03-15 NOTE — Progress Notes (Signed)
 PROGRESS NOTE Barbara Maynard  FMW:[id number] DOB: [birth year] DOA: 03/13/2024 PCP: Patient, No Pcp Per  Brief Narrative/Hospital Course: Barbara Maynard is a 43 y.o. 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  The patient offered limited stor,and is not sure if she has been treated for syphilis or not.  Labs: white count of 13.2, hemoglobin 13.2 g/dL platelets 720.  Serum pregnancy test is negative.  Normal wet prep.  CMP was unremarkable. ED course: Initial vital signs were temp 98.8 F, pulse 70, respiration 18, BP 134/66 mmHg and O2 sat 9923 ceftriaxone 1 g IVPB, fosfomycin 3 g p.o. x 1, ketorolac  30 mg IVP, started on valacyclovir 1000 twice daily and Percocet 5/325 mg as needed. Ct abd pelvis w/: Bilateral pelvic lymphadenopathy, including necrotic inguinal nodes, as above. This appearance is likely reactive/infectious, but warrants attention on clinical follow-up. If persistent, consider tissue sampling.  ID was consulted and admitted  Subjective: Seen and examined today She is sleeping, did not want to be bothered much but she feels fine no specific complaint Still has rash on her arms Overnight on room air afebrile, VSS, Labs mild hypokalemia otherwise stable CMP CBC On admission WBC 60-70 UA with many bacteria  Assessment and plan:  Lymphadenopathy Vaginal discharge/lesions Annular with central clearing rash: Patient also complaining of floaters headache, concern for neurosyphilis.  In infectious disease input appreciated starting doxycycline twice daily x 14 days and LP VDRL to see if she needs IV penicillin, checking urine GC and chlamydia.  Her HIV is negative LP has been ordered-awaiting IR to  complete.  Will message IR team to see if LP can be done over the weekend.  HSV genital ulcers: Continue Valtrex 1 3 times daily x 7 to 10 days  Polysubstance abuse Bipolar 1 disorder Tobacco abuse: Cessation counseled.  TOC consult At this time currently not in treatment for BPD  Leukocytosis: Resolved  Hypokalemia: Replace  DVT prophylaxis: SCDs Start: 03/14/24 0044 Code Status:   Code Status: Full Code Family Communication: plan of care discussed with patient at bedside. Patient status is: Remains hospitalized because of severity of illness Level of care: Telemetry   Dispo: The patient is from: home            Anticipated disposition: TBD Objective: Vitals last 24 hrs: Vitals:   03/14/24 1537 03/14/24 1538 03/14/24 2024 03/15/24 0428  BP:  (!) 137/90 (!) 122/53 120/70  Pulse:  72 65 (!) 50  Resp:  20 17 17   Temp: 98.9 F (37.2 C) 98.9 F (37.2 C) 98.6 F (37 C) 99.3 F (37.4 C)  TempSrc: Oral Oral Oral Oral  SpO2:  99% 98% 98%  Weight:  62 kg    Height:  5' 7 (1.702 m)      Physical Examination: General exam: alert awake, oriented, older than stated age HEENT:Oral mucosa moist, Ear/Nose WNL grossly Respiratory system: Bilaterally clear BS,no use of accessory muscle Cardiovascular system: S1 & S2 +, No JVD. Gastrointestinal system: Abdomen soft,NT,ND, BS+ Nervous System: Alert, awake, moving all extremities,and following commands. Extremities: extremities warm, leg edema neg Skin: Warm, rashes felt clearing on arms, torso Buttock and palmar rash MSK: Normal muscle bulk,tone, power   Medications reviewed:  Scheduled Meds:  clotrimazole  Topical BID   doxycycline  100 mg Oral Q12H   influenza vac split trivalent PF  0.5 mL Intramuscular Tomorrow-1000   pneumococcal 20-valent conjugate vaccine  0.5 mL Intramuscular Tomorrow-1000   valACYclovir  1,000 mg Oral BID   Continuous Infusions:  cefTRIAXone (ROCEPHIN)  IV Stopped (03/14/24 2345)   Diet: Diet  Order             Diet regular Room service appropriate? Yes; Fluid consistency: Thin  Diet effective now                    Data Reviewed: I have personally reviewed following labs and imaging studies ( see epic result tab) CBC: Recent Labs  Lab 03/13/24 2043 03/14/24 0517  WBC 13.2* 10.0  NEUTROABS 10.8* 7.9*  HGB 13.2 13.9  HCT 42.3 44.2  MCV 94.0 93.8  PLT 279 248   CMP: Recent Labs  Lab 03/13/24 2043 03/14/24 0517  NA 139 140  K 4.0 3.4*  CL 105 105  CO2 26 27  GLUCOSE 74 128*  BUN 12 11  CREATININE 0.72 0.81  CALCIUM 9.2 9.0  MG  --  2.2   GFR: Estimated Creatinine Clearance: 88.9 mL/min (by C-G formula based on SCr of 0.81 mg/dL). Recent Labs  Lab 03/13/24 2043 03/14/24 0517  AST 16 17  ALT 7 9  ALKPHOS 73 62  BILITOT 0.2 <0.2  PROT 6.6 6.7  ALBUMIN 3.8 3.7   No results for input(s): LIPASE, AMYLASE in the last 168 hours. No results for input(s): AMMONIA in the last 168 hours. Coagulation Profile: No results for input(s): INR, PROTIME in the last 168 hours. Unresulted Labs (From admission, onward)     Start     Ordered   03/13/24 2306  Hsv Culture And Typing  Once,   URGENT       Question Answer Comment  Patient immune status Normal   Release to patient Immediate      03/13/24 2305   03/13/24 2043  T.pallidum Ab, Total  Once,   R        03/13/24 2043           Antimicrobials/Microbiology: Anti-infectives (From admission, onward)    Start     Dose/Rate Route Frequency Ordered Stop   03/15/24 0000  cefTRIAXone (ROCEPHIN) 1 g in sodium chloride  0.9 % 100 mL IVPB        1 g 200 mL/hr over 30 Minutes Intravenous Every 24 hours 03/14/24 0802     03/14/24 1800  doxycycline (VIBRA-TABS) tablet 100 mg        100 mg Oral Every 12 hours 03/14/24 1704     03/14/24 0030  valACYclovir (VALTREX) tablet 1,000 mg        1,000 mg Oral 2 times daily 03/14/24 0016     03/14/24 0030  cefTRIAXone (ROCEPHIN) 1 g in sodium chloride  0.9 % 100 mL  IVPB        1 g 200 mL/hr over 30 Minutes Intravenous  Once 03/14/24 0016 03/14/24 0135   03/13/24 2245  fosfomycin (MONUROL) packet 3 g        3 g Oral  Once 03/13/24 2239 03/13/24 2309         Component Value Date/Time   SDES URINE, CLEAN CATCH 10/23/2021 1647   SPECREQUEST  10/23/2021 1647    NONE Performed at [facility], [address], [place], [place] [address]    CULT 40,000 COLONIES/mL KLEBSIELLA PNEUMONIAE (  A) 10/23/2021 1647   REPTSTATUS 10/27/2021 FINAL 10/23/2021 1647    Procedures:    Mennie LAMY, MD [facility] 03/15/2024, 11:42 AM

## 2024-03-15 NOTE — Plan of Care (Signed)
  Problem: Education: Goal: Knowledge of General Education information will improve Description: Including pain rating scale, medication(s)/side effects and non-pharmacologic comfort measures Outcome: Progressing   Problem: Health Behavior/Discharge Planning: Goal: Ability to manage health-related needs will improve Outcome: Progressing   Problem: Clinical Measurements: Goal: Ability to maintain clinical measurements within normal limits will improve Outcome: Progressing Goal: Will remain free from infection Outcome: Progressing Goal: Diagnostic test results will improve Outcome: Progressing Goal: Respiratory complications will improve Outcome: Progressing Goal: Cardiovascular complication will be avoided Outcome: Progressing   Problem: Activity: Goal: Risk for activity intolerance will decrease Outcome: Progressing   Problem: Nutrition: Goal: Adequate nutrition will be maintained Outcome: Progressing   Problem: Coping: Goal: Level of anxiety will decrease Outcome: Progressing   Problem: Safety: Goal: Ability to remain free from injury will improve Outcome: Progressing   Problem: Pain Managment: Goal: General experience of comfort will improve and/or be controlled Outcome: Progressing   Problem: Skin Integrity: Goal: Risk for impaired skin integrity will decrease Outcome: Progressing

## 2024-03-15 NOTE — Plan of Care (Signed)
  Problem: Education: Goal: Knowledge of General Education information will improve Description: Including pain rating scale, medication(s)/side effects and non-pharmacologic comfort measures Outcome: Progressing   Problem: Health Behavior/Discharge Planning: Goal: Ability to manage health-related needs will improve Outcome: Progressing   Problem: Clinical Measurements: Goal: Ability to maintain clinical measurements within normal limits will improve Outcome: Progressing Goal: Respiratory complications will improve Outcome: Progressing   Problem: Clinical Measurements: Goal: Will remain free from infection Outcome: Not Progressing Goal: Diagnostic test results will improve Outcome: Not Progressing

## 2024-03-16 LAB — CBC
HCT: 42 % (ref 36.0–46.0)
Hemoglobin: 13.6 g/dL (ref 12.0–15.0)
MCH: 30.3 pg (ref 26.0–34.0)
MCHC: 32.4 g/dL (ref 30.0–36.0)
MCV: 93.5 fL (ref 80.0–100.0)
Platelets: 253 K/uL (ref 150–400)
RBC: 4.49 MIL/uL (ref 3.87–5.11)
RDW: 13.1 % (ref 11.5–15.5)
WBC: 7.6 K/uL (ref 4.0–10.5)
nRBC: 0 % (ref 0.0–0.2)

## 2024-03-16 LAB — COMPREHENSIVE METABOLIC PANEL WITH GFR
ALT: 26 U/L (ref 0–44)
AST: 30 U/L (ref 15–41)
Albumin: 3.8 g/dL (ref 3.5–5.0)
Alkaline Phosphatase: 63 U/L (ref 38–126)
Anion gap: 9 (ref 5–15)
BUN: 15 mg/dL (ref 6–20)
CO2: 25 mmol/L (ref 22–32)
Calcium: 9.6 mg/dL (ref 8.9–10.3)
Chloride: 104 mmol/L (ref 98–111)
Creatinine, Ser: 0.66 mg/dL (ref 0.44–1.00)
GFR, Estimated: >60 mL/min (ref >60)
Glucose, Bld: 110 mg/dL — ABNORMAL HIGH (ref 70–99)
Potassium: 4.3 mmol/L (ref 3.5–5.1)
Sodium: 139 mmol/L (ref 135–145)
Total Bilirubin: <0.2 mg/dL (ref 0.0–1.2)
Total Protein: 7 g/dL (ref 6.5–8.1)

## 2024-03-16 LAB — MAGNESIUM: Magnesium: 2 mg/dL (ref 1.7–2.4)

## 2024-03-16 MED ORDER — HYDROXYZINE HCL 25 MG PO TABS
25.0000 mg | ORAL_TABLET | Freq: Three times a day (TID) | ORAL | Status: DC | PRN
Start: 2024-03-16 — End: 2024-03-26
  Administered 2024-03-16 – 2024-03-26 (×18): 25 mg via ORAL
  Filled 2024-03-16 (×19): qty 1

## 2024-03-16 NOTE — Plan of Care (Signed)
  Problem: Clinical Measurements: Goal: Ability to maintain clinical measurements within normal limits will improve Outcome: Progressing   Problem: Activity: Goal: Risk for activity intolerance will decrease Outcome: Progressing   Problem: Elimination: Goal: Will not experience complications related to bowel motility Outcome: Progressing Goal: Will not experience complications related to urinary retention Outcome: Progressing   Problem: Pain Managment: Goal: General experience of comfort will improve and/or be controlled Outcome: Progressing   Problem: Safety: Goal: Ability to remain free from injury will improve Outcome: Progressing   Problem: Skin Integrity: Goal: Risk for impaired skin integrity will decrease Outcome: Progressing

## 2024-03-16 NOTE — Progress Notes (Signed)
 PROGRESS NOTE Barbara Maynard  FMW:[id number] DOB: March 13, 1981 DOA: 03/13/2024 PCP: Patient, No Pcp Per  Brief Narrative/Hospital Course: Barbara Maynard is a 43 y.o. 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  The patient offered limited stor,and is not sure if she has been treated for syphilis or not.  Labs: white count of 13.2, hemoglobin 13.2 g/dL platelets 720.  Serum pregnancy test is negative.  Normal wet prep.  CMP was unremarkable. ED course: Initial vital signs were temp 98.8 F, pulse 70, respiration 18, BP 134/66 mmHg and O2 sat 9923 ceftriaxone 1 g IVPB, fosfomycin 3 g p.o. x 1, ketorolac  30 mg IVP, started on valacyclovir 1000 twice daily and Percocet 5/325 mg as needed. Ct abd pelvis w/: Bilateral pelvic lymphadenopathy, including necrotic inguinal nodes, as above. This appearance is likely reactive/infectious, but warrants attention on clinical follow-up. If persistent, consider tissue sampling.  ID was consulted and admitted Patient underwent LP 10/18  Subjective:  Seen and examined  Overnight remains afebrile BP stable on room air Complains of itching  But she has no new complaints Rash on forearm and clearing up.  Assessment and plan:  Lymphadenopathy Vaginal discharge/lesions Annular with central clearing rash: Patient also complaining of floaters headache, concern for neurosyphilis. ID input appreciated - Cont doxycycline bid x 14 days GC and chlamydia hiv NEGATIVE LP 10/18-glucose 56 WBC 2 RBC 7 total protein 54 Gram stain no organisms seen, follow-up VDRL CSF, CSF FTA-ABS and it is more sensitive Penicillin G continuous infusion started 10/18 pending above result  HSV genital  ulcers: Continue Valtrex tid x 7-10 days  Polysubstance abuse Bipolar 1 disorder Tobacco abuse: Cessation counseled.TOC consulted.Not in treatment for BPD.  Leukocytosis: Resolved  Hypokalemia: Replaced  DVT prophylaxis: SCDs Start: 03/14/24 0044 Code Status:   Code Status: Full Code Family Communication: plan of care discussed with patient at bedside. Patient status is: Remains hospitalized because of severity of illness Level of care: Telemetry   Dispo: The patient is from: home            Anticipated disposition: TBD Objective: Vitals last 24 hrs: Vitals:   03/15/24 2019 03/16/24 0429 03/16/24 0500 03/16/24 1319  BP: (!) 142/79 126/61  (!) 116/54  Pulse: (!) 58 60  (!) 51  Resp: 16 15    Temp: 98.3 F (36.8 C) 97.8 F (36.6 C)  97.8 F (36.6 C)  TempSrc: Oral Oral  Oral  SpO2: 99% 98%  98%  Weight:   66.4 kg   Height:        Physical Examination: General exam: AAOX3 HEENT:Oral mucosa moist, Ear/Nose WNL grossly Respiratory system: Bilaterally clear BS,no use of accessory muscle Cardiovascular system: S1 & S2 +, No JVD. Gastrointestinal system: Abdomen soft,NT,ND, BS+ Nervous System: Alert, awake, moving all extremities,and following commands. Extremities: extremities warm, leg edema neg Skin: Warm, faint rashes + on arms MSK: Normal muscle bulk,tone, power   Medications reviewed:  Scheduled Meds:  clotrimazole   Topical BID   doxycycline  100 mg Oral Q12H   influenza vac split trivalent PF  0.5 mL Intramuscular Tomorrow-1000   valACYclovir  1,000 mg Oral BID   Continuous Infusions:  cefTRIAXone (ROCEPHIN)  IV Stopped (03/15/24 2351)   penicillin G potassium 24 Million  Units in dextrose 5 % 500 mL CONTINUOUS infusion Stopped (03/15/24 2321)   Diet: Diet Order             Diet regular Room service appropriate? Yes; Fluid consistency: Thin  Diet effective now                    Data Reviewed: I have personally reviewed following labs and  imaging studies ( see epic result tab) CBC: Recent Labs  Lab 03/13/24 2043 03/14/24 0517 03/16/24 0613  WBC 13.2* 10.0 7.6  NEUTROABS 10.8* 7.9*  --   HGB 13.2 13.9 13.6  HCT 42.3 44.2 42.0  MCV 94.0 93.8 93.5  PLT 279 248 253   CMP: Recent Labs  Lab 03/13/24 2043 03/14/24 0517 03/16/24 0613  NA 139 140 139  K 4.0 3.4* 4.3  CL 105 105 104  CO2 26 27 25   GLUCOSE 74 128* 110*  BUN 12 11 15   CREATININE 0.72 0.81 0.66  CALCIUM 9.2 9.0 9.6  MG  --  2.2 2.0   GFR: Estimated Creatinine Clearance: 90 mL/min (by C-G formula based on SCr of 0.66 mg/dL). Recent Labs  Lab 03/13/24 2043 03/14/24 0517 03/16/24 0613  AST 16 17 30   ALT 7 9 26   ALKPHOS 73 62 63  BILITOT 0.2 <0.2 <0.2  PROT 6.6 6.7 7.0  ALBUMIN 3.8 3.7 3.8   No results for input(s): LIPASE, AMYLASE in the last 168 hours. No results for input(s): AMMONIA in the last 168 hours. Coagulation Profile: No results for input(s): INR, PROTIME in the last 168 hours. Unresulted Labs (From admission, onward)     Start     Ordered   03/15/24 1148  VDRL, CSF  RELEASE UPON ORDERING,   TIMED        03/15/24 1148   03/13/24 2306  Hsv Culture And Typing  Once,   URGENT       Question Answer Comment  Patient immune status Normal   Release to patient Immediate      03/13/24 2305   03/13/24 2043  T.pallidum Ab, Total  Once,   R        03/13/24 2043           Antimicrobials/Microbiology: Anti-infectives (From admission, onward)    Start     Dose/Rate Route Frequency Ordered Stop   03/15/24 1830  penicillin G potassium 24 Million Units in dextrose 5 % 500 mL CONTINUOUS infusion        24 Million Units 20.8 mL/hr over 24 Hours Intravenous Every 24 hours 03/15/24 1747     03/15/24 0000  cefTRIAXone (ROCEPHIN) 1 g in sodium chloride  0.9 % 100 mL IVPB        1 g 200 mL/hr over 30 Minutes Intravenous Every 24 hours 03/14/24 0802     03/14/24 1800  doxycycline (VIBRA-TABS) tablet 100 mg        100 mg Oral Every 12  hours 03/14/24 1704     03/14/24 0030  valACYclovir (VALTREX) tablet 1,000 mg        1,000 mg Oral 2 times daily 03/14/24 0016     03/14/24 0030  cefTRIAXone (ROCEPHIN) 1 g in sodium chloride  0.9 % 100 mL IVPB        1 g 200 mL/hr over 30 Minutes Intravenous  Once 03/14/24 0016 03/14/24 0135   03/13/24 2245  fosfomycin (MONUROL) packet 3 g        3 g Oral  Once 03/13/24  2239 03/13/24 2309         Component Value Date/Time   SDES  03/15/2024 1115    CSF Performed at [facility], [address] [place], [place] [address]    SPECREQUEST  03/15/2024 1115    NONE Performed at [facility], [address], [place], [place] [address]    CULT  03/15/2024 1115    NO GROWTH < 24 HOURS Performed at [facility], [address], [place], [place] [address]    REPTSTATUS PENDING 03/15/2024 1115  Procedures:  Mennie LAMY, MD [facility] 03/16/2024, 1:34 PM

## 2024-03-16 NOTE — Plan of Care (Signed)
  Problem: Education: Goal: Knowledge of General Education information will improve Description: Including pain rating scale, medication(s)/side effects and non-pharmacologic comfort measures Outcome: Progressing   Problem: Health Behavior/Discharge Planning: Goal: Ability to manage health-related needs will improve Outcome: Progressing   Problem: Clinical Measurements: Goal: Ability to maintain clinical measurements within normal limits will improve Outcome: Progressing Goal: Will remain free from infection Outcome: Progressing Goal: Diagnostic test results will improve Outcome: Progressing Goal: Respiratory complications will improve Outcome: Progressing Goal: Cardiovascular complication will be avoided Outcome: Progressing   Problem: Activity: Goal: Risk for activity intolerance will decrease Outcome: Progressing   Problem: Nutrition: Goal: Adequate nutrition will be maintained Outcome: Progressing   Problem: Pain Managment: Goal: General experience of comfort will improve and/or be controlled Outcome: Progressing   Problem: Elimination: Goal: Will not experience complications related to bowel motility Outcome: Progressing Goal: Will not experience complications related to urinary retention Outcome: Progressing   Problem: Skin Integrity: Goal: Risk for impaired skin integrity will decrease Outcome: Progressing   Problem: Safety: Goal: Ability to remain free from injury will improve Outcome: Progressing

## 2024-03-17 DIAGNOSIS — A510 Primary genital syphilis: Secondary | ICD-10-CM

## 2024-03-17 DIAGNOSIS — A5139 Other secondary syphilis of skin: Secondary | ICD-10-CM

## 2024-03-17 DIAGNOSIS — A55 Chlamydial lymphogranuloma (venereum): Secondary | ICD-10-CM

## 2024-03-17 DIAGNOSIS — A539 Syphilis, unspecified: Secondary | ICD-10-CM

## 2024-03-17 DIAGNOSIS — Z5900 Homelessness unspecified: Secondary | ICD-10-CM

## 2024-03-17 LAB — HSV CULTURE AND TYPING

## 2024-03-17 LAB — T.PALLIDUM AB, TOTAL: T Pallidum Abs: REACTIVE — AB

## 2024-03-17 MED ORDER — DOXYCYCLINE HYCLATE 100 MG PO TABS
100.0000 mg | ORAL_TABLET | Freq: Two times a day (BID) | ORAL | Status: DC
Start: 2024-03-17 — End: 2024-03-26
  Administered 2024-03-17 – 2024-03-26 (×19): 100 mg via ORAL
  Filled 2024-03-17 (×19): qty 1

## 2024-03-17 NOTE — Plan of Care (Signed)
  Problem: Education: Goal: Knowledge of General Education information will improve Description: Including pain rating scale, medication(s)/side effects and non-pharmacologic comfort measures Outcome: Progressing   Problem: Health Behavior/Discharge Planning: Goal: Ability to manage health-related needs will improve Outcome: Progressing   Problem: Clinical Measurements: Goal: Ability to maintain clinical measurements within normal limits will improve Outcome: Progressing Goal: Diagnostic test results will improve Outcome: Progressing Goal: Respiratory complications will improve Outcome: Progressing Goal: Cardiovascular complication will be avoided Outcome: Progressing   Problem: Clinical Measurements: Goal: Will remain free from infection Outcome: Not Progressing

## 2024-03-17 NOTE — Progress Notes (Signed)
 Date of Admission:  03/13/2024   Total days of antibiotics ***        Day ***        Day ***        Day ***   ID: Barbara Maynard is a 43 y.o. 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   Pelvic lymphadenopathy   Bipolar 1 disorder (HCC)   Polysubstance abuse (HCC)   Tobacco use   Hypokalemia   Rash in adult    Subjective: ***  Medications:   clotrimazole   Topical BID   doxycycline  100 mg Oral Q12H   valACYclovir  1,000 mg Oral BID    Objective: Vital signs in last 24 hours: Patient Vitals for the past 24 hrs:  BP Temp Temp src Pulse Resp SpO2  03/17/24 0437 108/79 97.9 F (36.6 C) Oral 60 14 97 %  03/16/24 2108 117/67 98.7 F (37.1 C) Oral 61 16 98 %  03/16/24 1319 (!) 116/54 97.8 F (36.6 C) Oral (!) 51 -- 98 %     Lines and Device Date on insertion # of days DC  Engineer, technical sales     ETT       PHYSICAL EXAM:  General: Alert, cooperative, no distress, appears stated age.  Head: Normocephalic, without obvious abnormality, atraumatic. Eyes: Conjunctivae clear, anicteric sclerae. Pupils are equal ENT Nares normal. No drainage or sinus tenderness. Lips, mucosa, and tongue normal. No Thrush Neck: Supple, symmetrical, no adenopathy, thyroid: non tender no carotid bruit and no JVD. Back: No CVA tenderness. Lungs: Clear to auscultation bilaterally. No Wheezing or Rhonchi. No rales. Heart: Regular rate and rhythm, no murmur, rub or gallop. Abdomen: Soft, non-tender,not distended. Bowel sounds normal. No masses Extremities: atraumatic, no cyanosis. No edema. No clubbing Skin: No rashes or lesions. Or bruising Lymph: Cervical, supraclavicular normal. Neurologic: Grossly non-focal  Lab Results    Latest Ref Rng & Units 03/16/2024    6:13 AM 03/14/2024    5:17 AM 03/13/2024    8:43 PM  CBC  WBC 4.0 - 10.5 K/uL 7.6  10.0  13.2   Hemoglobin 12.0 - 15.0 g/dL 86.3  86.0  86.7    Hematocrit 36.0 - 46.0 % 42.0  44.2  42.3   Platelets 150 - 400 K/uL 253  248  279        Latest Ref Rng & Units 03/16/2024    6:13 AM 03/14/2024    5:17 AM 03/13/2024    8:43 PM  CMP  Glucose 70 - 99 mg/dL 889  871  74   BUN 6 - 20 mg/dL 15  11  12    Creatinine 0.44 - 1.00 mg/dL 9.33  9.18  9.27   Sodium 135 - 145 mmol/L 139  140  139   Potassium 3.5 - 5.1 mmol/L 4.3  3.4  4.0   Chloride 98 - 111 mmol/L 104  105  105   CO2 22 - 32 mmol/L 25  27  26    Calcium 8.9 - 10.3 mg/dL 9.6  9.0  9.2   Total Protein 6.5 - 8.1 g/dL 7.0  6.7  6.6   Total Bilirubin 0.0 - 1.2 mg/dL <9.7  <9.7  0.2   Alkaline Phos 38 - 126 U/L 63  62  73   AST 15 - 41 U/L 30  17  16    ALT 0 - 44 U/L 26  9  7       Microbiology:  Studies/Results: No results found.   Assessment/Plan: ***

## 2024-03-17 NOTE — Plan of Care (Signed)
  Problem: Education: Goal: Knowledge of General Education information will improve Description: Including pain rating scale, medication(s)/side effects and non-pharmacologic comfort measures Outcome: Progressing   Problem: Health Behavior/Discharge Planning: Goal: Ability to manage health-related needs will improve Outcome: Progressing   Problem: Clinical Measurements: Goal: Ability to maintain clinical measurements within normal limits will improve Outcome: Progressing   Problem: Activity: Goal: Risk for activity intolerance will decrease Outcome: Progressing   Problem: Nutrition: Goal: Adequate nutrition will be maintained Outcome: Progressing   Problem: Pain Managment: Goal: General experience of comfort will improve and/or be controlled Outcome: Progressing   Problem: Skin Integrity: Goal: Risk for impaired skin integrity will decrease Outcome: Progressing

## 2024-03-17 NOTE — Progress Notes (Signed)
 PROGRESS NOTE Barbara Maynard  FMW:[id number] DOB: [birth year] DOA: 03/13/2024 PCP: Patient, No Pcp Per  Brief Narrative/Hospital Course: Barbara Maynard is a 43 y.o. 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  The patient offered limited stor,and is not sure if she has been treated for syphilis or not.  Labs: white count of 13.2, hemoglobin 13.2 g/dL platelets 720.  Serum pregnancy test is negative.  Normal wet prep.  CMP was unremarkable. ED course: Initial vital signs were temp 98.8 F, pulse 70, respiration 18, BP 134/66 mmHg and O2 sat 9923 ceftriaxone 1 g IVPB, fosfomycin 3 g p.o. x 1, ketorolac  30 mg IVP, started on valacyclovir 1000 twice daily and Percocet 5/325 mg as needed. Ct abd pelvis w/: Bilateral pelvic lymphadenopathy, including necrotic inguinal nodes, as above. This appearance is likely reactive/infectious, but warrants attention on clinical follow-up. If persistent, consider tissue sampling.  ID was consulted and admitted Patient underwent LP 10/18  Subjective:  Seen and examined Overnight patient remains afebrile BP stable Rash improving, Overall resting bed no new complaints.   Assessment and plan:  Lymphadenopathy Vaginal discharge/lesions Annular with central clearing rash: Patient also complaining of floaters headache, concern for neurosyphilis. ID  following on doxycycline bid x 14 days and iv penicillin. GC and chlamydia hiv NEGATIVE S/p LP 10/18-glucose 56 WBC 2 RBC 7 total protein 54 Gram stain no organisms seen pending VDRL CSF, CSF FTA-ABS ,  Treponema p Ab Discussed with infectious disease team this morning  HSV genital ulcers: Continue Valtrex tid x 7-10  days  Polysubstance abuse Bipolar 1 disorder Tobacco abuse: Cessation counseled.TOC consulted.Not in treatment for BPD.  Leukocytosis: Resolved  Hypokalemia: Replaced  DVT prophylaxis: SCDs Start: 03/14/24 0044 Code Status:   Code Status: Full Code Family Communication: plan of care discussed with patient at bedside. Patient status is: Remains hospitalized because of severity of illness Level of care: Telemetry   Dispo: The patient is from: homeless            Anticipated disposition: TBD Objective: Vitals last 24 hrs: Vitals:   03/16/24 0500 03/16/24 1319 03/16/24 2108 03/17/24 0437  BP:  (!) 116/54 117/67 108/79  Pulse:  (!) 51 61 60  Resp:   16 14  Temp:  97.8 F (36.6 C) 98.7 F (37.1 C) 97.9 F (36.6 C)  TempSrc:  Oral Oral Oral  SpO2:  98% 98% 97%  Weight: 66.4 kg     Height:        Physical Examination: General exam: AAOX3 HEENT:Oral mucosa moist, Ear/Nose WNL grossly Respiratory system: CTA b/l  Cardiovascular system: S1 & S2 +, No JVD. Gastrointestinal system: Abdomen soft,NT,ND, BS+ Nervous System: Alert, awake, moving all extremities,and following commands. Extremities: extremities warm, leg edema neg Skin: Warm, faint rashes + on arms MSK: Normal muscle bulk,tone, power   Medications reviewed:  Scheduled Meds:  clotrimazole   Topical BID   doxycycline  100 mg Oral Q12H   influenza vac split trivalent PF  0.5 mL Intramuscular Tomorrow-1000   valACYclovir  1,000 mg Oral BID   Continuous Infusions:  cefTRIAXone (ROCEPHIN)  IV 1 g (03/16/24 2317)   penicillin G potassium 24 Million Units in dextrose 5 % 500 mL CONTINUOUS infusion 24 Million Units (03/16/24 2240)  Diet: Diet Order             Diet regular Room service appropriate? Yes; Fluid consistency: Thin  Diet effective now                    Data Reviewed: I have personally reviewed following labs and imaging studies ( see epic result tab) CBC: Recent Labs  Lab 03/13/24 2043  03/14/24 0517 03/16/24 0613  WBC 13.2* 10.0 7.6  NEUTROABS 10.8* 7.9*  --   HGB 13.2 13.9 13.6  HCT 42.3 44.2 42.0  MCV 94.0 93.8 93.5  PLT 279 248 253   CMP: Recent Labs  Lab 03/13/24 2043 03/14/24 0517 03/16/24 0613  NA 139 140 139  K 4.0 3.4* 4.3  CL 105 105 104  CO2 26 27 25   GLUCOSE 74 128* 110*  BUN 12 11 15   CREATININE 0.72 0.81 0.66  CALCIUM 9.2 9.0 9.6  MG  --  2.2 2.0   GFR: Estimated Creatinine Clearance: 90 mL/min (by C-G formula based on SCr of 0.66 mg/dL). Recent Labs  Lab 03/13/24 2043 03/14/24 0517 03/16/24 0613  AST 16 17 30   ALT 7 9 26   ALKPHOS 73 62 63  BILITOT 0.2 <0.2 <0.2  PROT 6.6 6.7 7.0  ALBUMIN 3.8 3.7 3.8   No results for input(s): LIPASE, AMYLASE in the last 168 hours. No results for input(s): AMMONIA in the last 168 hours. Coagulation Profile: No results for input(s): INR, PROTIME in the last 168 hours. Unresulted Labs (From admission, onward)     Start     Ordered   03/15/24 1148  VDRL, CSF  RELEASE UPON ORDERING,   TIMED        03/15/24 1148   03/13/24 2043  T.pallidum Ab, Total  Once,   R        03/13/24 2043           Antimicrobials/Microbiology: Anti-infectives (From admission, onward)    Start     Dose/Rate Route Frequency Ordered Stop   03/17/24 1045  doxycycline (VIBRA-TABS) tablet 100 mg        100 mg Oral Every 12 hours 03/17/24 0952     03/15/24 1830  penicillin G potassium 24 Million Units in dextrose 5 % 500 mL CONTINUOUS infusion        24 Million Units 20.8 mL/hr over 24 Hours Intravenous Every 24 hours 03/15/24 1747     03/15/24 0000  cefTRIAXone (ROCEPHIN) 1 g in sodium chloride  0.9 % 100 mL IVPB  Status:  Discontinued        1 g 200 mL/hr over 30 Minutes Intravenous Every 24 hours 03/14/24 0802 03/17/24 0953   03/14/24 1800  doxycycline (VIBRA-TABS) tablet 100 mg  Status:  Discontinued        100 mg Oral Every 12 hours 03/14/24 1704 03/17/24 0940   03/14/24 0030  valACYclovir (VALTREX) tablet  1,000 mg        1,000 mg Oral 2 times daily 03/14/24 0016     03/14/24 0030  cefTRIAXone (ROCEPHIN) 1 g in sodium chloride  0.9 % 100 mL IVPB        1 g 200 mL/hr over 30 Minutes Intravenous  Once 03/14/24 0016 03/14/24 0135   03/13/24 2245  fosfomycin (MONUROL) packet 3 g        3 g Oral  Once 03/13/24 2239 03/13/24 2309         Component Value Date/Time   SDES  03/15/2024 1115  CSF Performed at [facility], [address], [place], [place] [address]    SPECREQUEST  03/15/2024 1115    NONE Performed at [facility], [address] [place], [place] [address]    CULT  03/15/2024 1115    NO GROWTH 2 DAYS Performed at [facility], [address] [place], [place] [address]    REPTSTATUS PENDING 03/15/2024 1115  Procedures:  Mennie LAMY, MD [facility] 03/17/2024, 10:52 AM

## 2024-03-18 DIAGNOSIS — A5149 Other secondary syphilitic conditions: Secondary | ICD-10-CM

## 2024-03-18 LAB — CYTOLOGY - NON PAP

## 2024-03-18 LAB — VDRL, CSF: VDRL Quant, CSF: NONREACTIVE

## 2024-03-18 MED ORDER — TRAZODONE HCL 50 MG PO TABS
50.0000 mg | ORAL_TABLET | Freq: Every evening | ORAL | Status: DC | PRN
  Administered 2024-03-18 – 2024-03-23 (×8): 50 mg via ORAL
  Filled 2024-03-18 (×8): qty 1

## 2024-03-18 NOTE — Progress Notes (Signed)
 PROGRESS NOTE Barbara Maynard  FMW:[id number] DOB: 1981/05/02 DOA: 03/13/2024 PCP: Patient, No Pcp Per  Brief Narrative/Hospital Course: Barbara Maynard is a 43 y.o. 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  The patient offered limited stor,and is not sure if she has been treated for syphilis or not.  Labs: white count of 13.2, hemoglobin 13.2 g/dL platelets 720.  Serum pregnancy test is negative.  Normal wet prep.  CMP was unremarkable. ED course: Initial vital signs were temp 98.8 F, pulse 70, respiration 18, BP 134/66 mmHg and O2 sat 9923 ceftriaxone 1 g IVPB, fosfomycin 3 g p.o. x 1, ketorolac  30 mg IVP, started on valacyclovir 1000 twice daily and Percocet 5/325 mg as needed. Ct abd pelvis w/: Bilateral pelvic lymphadenopathy, including necrotic inguinal nodes, as above. This appearance is likely reactive/infectious, but warrants attention on clinical follow-up. If persistent, consider tissue sampling.  ID was consulted and admitted Patient underwent LP 10/18  Subjective:  Seen and examined Sting comfortably requesting to see psychiatry for her anxiety depression and has not been on meds for Overnight afebrile, bp stable, seeing floaters at times Rash improving, No new complaints  Assessment and plan:  Shotty Lymphadenopathy Vaginal discharge/lesions Annular with central clearing rash: Presentation concerning for syphilis with genital lesions as well as skin lesions.  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  If negative will discontinue IV penicillin.  Treponema  pallidum antibodies reactive currently on IV insulin.  She is homeless continuance of daily management, cannot be discharged on IV antibiotics Continue doxycycline for other STI disease Discussed with ID  HSV genital ulcers: Continue Valtrex tid x 7-10 days  Polysubstance abuse Bipolar 1 disorder Tobacco abuse: Cessation counseled.TOC consulted.Not in treatment for BPD.  Psychiatry has been consulted  Leukocytosis: Resolved  Hypokalemia: Replaced  DVT prophylaxis: SCDs Start: 03/14/24 0044 Code Status:   Code Status: Full Code Family Communication: plan of care discussed with patient at bedside. Patient status is: Remains hospitalized because of severity of illness Level of care: Telemetry   Dispo: The patient is from: homeless            Anticipated disposition: TBD Objective: Vitals last 24 hrs: Vitals:   03/16/24 2108 03/17/24 0437 03/17/24 1322 03/17/24 2323  BP: 117/67 108/79 109/66 114/68  Pulse: 61 60 (!) 57 (!) 57  Resp: 16 14 16 18   Temp: 98.7 F (37.1 C) 97.9 F (36.6 C) 98.2 F (36.8 C) 98.7 F (37.1 C)  TempSrc: Oral Oral Oral Oral  SpO2: 98% 97% 95% 97%  Weight:      Height:        Physical Examination: General exam: AAOX3, nad HEENT:Oral mucosa moist, Ear/Nose WNL grossly Respiratory system: CTA b/l  Cardiovascular system: S1 & S2 +, No JVD. Gastrointestinal system: Abdomen soft,NT,ND, BS+ Nervous System: Alert, awake, moving all extremities,and following commands. Extremities: extremities warm, leg edema neg Skin: Warm, faint rashes + on arms/thigh MSK: Normal muscle bulk,tone, power   Medications reviewed:  Scheduled Meds:  clotrimazole   Topical BID   doxycycline  100 mg Oral Q12H   valACYclovir  1,000 mg  Oral BID   Continuous Infusions:  penicillin G potassium 24 Million Units in dextrose 5 % 500 mL CONTINUOUS infusion 24 Million Units (03/18/24 0030)   Diet: Diet Order             Diet regular Room service appropriate? Yes; Fluid  consistency: Thin  Diet effective now                    Data Reviewed: I have personally reviewed following labs and imaging studies ( see epic result tab) CBC: Recent Labs  Lab 03/13/24 2043 03/14/24 0517 03/16/24 0613  WBC 13.2* 10.0 7.6  NEUTROABS 10.8* 7.9*  --   HGB 13.2 13.9 13.6  HCT 42.3 44.2 42.0  MCV 94.0 93.8 93.5  PLT 279 248 253   CMP: Recent Labs  Lab 03/13/24 2043 03/14/24 0517 03/16/24 0613  NA 139 140 139  K 4.0 3.4* 4.3  CL 105 105 104  CO2 26 27 25   GLUCOSE 74 128* 110*  BUN 12 11 15   CREATININE 0.72 0.81 0.66  CALCIUM 9.2 9.0 9.6  MG  --  2.2 2.0   GFR: Estimated Creatinine Clearance: 90 mL/min (by C-G formula based on SCr of 0.66 mg/dL). Recent Labs  Lab 03/13/24 2043 03/14/24 0517 03/16/24 0613  AST 16 17 30   ALT 7 9 26   ALKPHOS 73 62 63  BILITOT 0.2 <0.2 <0.2  PROT 6.6 6.7 7.0  ALBUMIN 3.8 3.7 3.8   No results for input(s): LIPASE, AMYLASE in the last 168 hours. No results for input(s): AMMONIA in the last 168 hours. Coagulation Profile: No results for input(s): INR, PROTIME in the last 168 hours. Unresulted Labs (From admission, onward)     Start     Ordered   03/15/24 1148  VDRL, CSF  RELEASE UPON ORDERING,   TIMED        03/15/24 1148           Antimicrobials/Microbiology: Anti-infectives (From admission, onward)    Start     Dose/Rate Route Frequency Ordered Stop   03/17/24 1045  doxycycline (VIBRA-TABS) tablet 100 mg        100 mg Oral Every 12 hours 03/17/24 0952     03/15/24 1830  penicillin G potassium 24 Million Units in dextrose 5 % 500 mL CONTINUOUS infusion        24 Million Units 20.8 mL/hr over 24 Hours Intravenous Every 24 hours 03/15/24 1747     03/15/24 0000  cefTRIAXone (ROCEPHIN) 1 g in sodium chloride  0.9 % 100 mL IVPB  Status:  Discontinued        1 g 200 mL/hr over 30 Minutes Intravenous Every 24 hours 03/14/24 0802 03/17/24 0953   03/14/24 1800  doxycycline (VIBRA-TABS) tablet 100 mg   Status:  Discontinued        100 mg Oral Every 12 hours 03/14/24 1704 03/17/24 0940   03/14/24 0030  valACYclovir (VALTREX) tablet 1,000 mg        1,000 mg Oral 2 times daily 03/14/24 0016     03/14/24 0030  cefTRIAXone (ROCEPHIN) 1 g in sodium chloride  0.9 % 100 mL IVPB        1 g 200 mL/hr over 30 Minutes Intravenous  Once 03/14/24 0016 03/14/24 0135   03/13/24 2245  fosfomycin (MONUROL) packet 3 g        3 g Oral  Once 03/13/24 2239 03/13/24 2309         Component Value Date/Time  SDES  03/15/2024 1115    CSF Performed at [facility], [address], [place], [place] [address]    SPECREQUEST  03/15/2024 1115    NONE Performed at [facility], [address], [place], [place] [address]    CULT  03/15/2024 1115    NO GROWTH 3 DAYS Performed at [facility], [address] [place], [place] [address]    REPTSTATUS PENDING 03/15/2024 1115  Procedures:  Mennie LAMY, MD [facility] 03/18/2024, 11:15 AM

## 2024-03-18 NOTE — Progress Notes (Signed)
 Date of Admission:  03/13/2024      ID: Barbara Maynard is a 43 y.o. 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   Pelvic lymphadenopathy   Bipolar 1 disorder (HCC)   Polysubstance abuse (HCC)   Tobacco use   Hypokalemia   Rash in adult   Secondary syphilis    Subjective: Patient states she is feeling better Medications:   clotrimazole   Topical BID   doxycycline  100 mg Oral Q12H   traZODone  50 mg Oral QHS,MR X 1   valACYclovir  1,000 mg Oral BID    Objective: Vital signs in last 24 hours: Patient Vitals for the past 24 hrs:  BP Temp Temp src Pulse Resp SpO2  03/18/24 1331 134/73 98.3 F (36.8 C) Oral 64 17 97 %  03/17/24 2323 114/68 98.7 F (37.1 C) Oral (!) 57 18 97 %     PHYSICAL EXAM:  General: Alert, cooperative, no distress, appears stated age.  Lungs: Clear to auscultation bilaterally. No Wheezing or Rhonchi. No rales. Heart: Regular rate and rhythm, no murmur, rub or gallop. Abdomen: Soft, non-tender,not distended. Bowel sounds normal. No masses Extremities: atraumatic, no cyanosis. No edema. No clubbing Skin: Rash is almost resolved Shotty inguinal lymph nodes left >> rt Lymph: Cervical, supraclavicular normal. Neurologic: Grossly non-focal  Lab Results    Latest Ref Rng & Units 03/16/2024    6:13 AM 03/14/2024    5:17 AM 03/13/2024    8:43 PM  CBC  WBC 4.0 - 10.5 K/uL 7.6  10.0  13.2   Hemoglobin 12.0 - 15.0 g/dL 86.3  86.0  86.7   Hematocrit 36.0 - 46.0 % 42.0  44.2  42.3   Platelets 150 - 400 K/uL 253  248  279        Latest Ref Rng & Units 03/16/2024    6:13 AM 03/14/2024    5:17 AM 03/13/2024    8:43 PM  CMP  Glucose 70 - 99 mg/dL 889  871  74   BUN 6 - 20 mg/dL 15  11  12    Creatinine 0.44 - 1.00 mg/dL 9.33  9.18  9.27   Sodium 135 - 145 mmol/L 139  140  139   Potassium 3.5 - 5.1 mmol/L 4.3  3.4  4.0   Chloride 98 - 111 mmol/L 104  105  105   CO2 22 - 32 mmol/L 25  27  26     Calcium 8.9 - 10.3 mg/dL 9.6  9.0  9.2   Total Protein 6.5 - 8.1 g/dL 7.0  6.7  6.6   Total Bilirubin 0.0 - 1.2 mg/dL <9.7  <9.7  0.2   Alkaline Phos 38 - 126 U/L 63  62  73   AST 15 - 41 U/L 30  17  16    ALT 0 - 44 U/L 26  9  7        Microbiology: CSF VDRL CSF FTA-ABS pending Studies/Results: No results found.   Assessment/Plan: Syphilis - genital lesions as well as skin lesions - secondary syphilis category but she has been having floaters and so a lumbar puncture was done and it showed 2 WBCs and 54 protein and hence VDRL and FTA-ABS test has been sent.  If they both come back positive then she has neurosyphilis.  If  negative then she does not have neurosyphilis Currently she is on IV penicillin and  depending on the test results we  will decide whether to continue or stop the IV penicillin Patient is homeless She cannot be discharged with IV antibiotics. Shotty lymphadenopathy Patient is also on doxycycline for other sexually transmitted diseases like.  LGV  HIV nonreactive  Discussed the management with the patient and the care team.

## 2024-03-18 NOTE — Progress Notes (Signed)
   03/18/24 1034  TOC Brief Assessment  Insurance and Status Lapsed  Patient has primary care physician No  Home environment has been reviewed Home  Prior level of function: Independent  Prior/Current Home Services No current home services  Social Drivers of Health Review SDOH reviewed needs interventions (currently refusing to discuss)  Readmission risk has been reviewed Yes  Transition of care needs transition of care needs identified, TOC will continue to follow

## 2024-03-18 NOTE — Consult Note (Signed)
 43 year old female with PMH of asthma, bipolar 1 disorder, borderline personality disorder, PTSD, herpes zoster, history of syphilis which has been treated who presented to the emergency department complaints of having a generalized rash, left lower quadrant pain for the past 3 days associated with genital ulcerative lesions concerning for HSV.   Psychiatry was consulted for patient request. Patient was seen and evaluated at bedside. The patient appeared disheveled with clear signs of poor personal hygiene. Her clothing was untidy, and she exhibited a lack of attention to grooming. The patient was in a dark room with the windows closed and lights off, which could suggest either a lack of interest in her surroundings or a preference for isolation. Although the patient was cooperative during the evaluation, her affect was notably flat, with minimal emotional expression or verbal interaction.   The patient reports that she is not currently seeing an outpatient psychiatrist or therapist, citing lack of transportation and unstable housing as significant barriers to consistent care. She expresses a desire to be restarted on psychiatric medications, specifically to help improve her sleep. The patient indicates that her sleep disturbances have been impacting her overall functioning, and she is seeking medications to help with this issue.  Given the patient's current lack of access to ongoing mental health care and her history of psychiatric symptoms, the plan is to provide continued medical monitoring until the patient is cleared for further treatment. In addition, a referral to Behavioral Health Urgent Care will be made so that the patient can receive both psychiatric treatment and therapy. This setting will help address both her immediate psychiatric needs and offer a stable environment for further care. Follow-up is recommended to monitor progress and adjust treatment as necessary.  -TOC referral for outpatient  psychiatric services.  -TOC to address SDOH and barriers.  -Will start Trazodone 50mg  may repeat x 1.  -Psychiatry service to sign off at this time.

## 2024-03-19 LAB — CSF CULTURE W GRAM STAIN
Culture: NO GROWTH
Gram Stain: NONE SEEN

## 2024-03-19 NOTE — Plan of Care (Signed)
  Problem: Education: Goal: Knowledge of General Education information will improve Description: Including pain rating scale, medication(s)/side effects and non-pharmacologic comfort measures Outcome: Progressing   Problem: Health Behavior/Discharge Planning: Goal: Ability to manage health-related needs will improve Outcome: Progressing   Problem: Clinical Measurements: Goal: Ability to maintain clinical measurements within normal limits will improve Outcome: Progressing   Problem: Activity: Goal: Risk for activity intolerance will decrease Outcome: Progressing   Problem: Nutrition: Goal: Adequate nutrition will be maintained Outcome: Progressing   Problem: Pain Managment: Goal: General experience of comfort will improve and/or be controlled Outcome: Progressing   Problem: Safety: Goal: Ability to remain free from injury will improve Outcome: Progressing

## 2024-03-19 NOTE — Progress Notes (Signed)
 PROGRESS NOTE Barbara Maynard  FMW:[id number] DOB: 03-17-1981 DOA: 03/13/2024 PCP: Patient, No Pcp Per  Brief Narrative/Hospital Course: Barbara Maynard is a 43 y.o. 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  The patient offered limited stor,and is not sure if she has been treated for syphilis or not.  Labs: white count of 13.2, hemoglobin 13.2 g/dL platelets 720.  Serum pregnancy test is negative.  Normal wet prep.  CMP was unremarkable. ED course: Initial vital signs were temp 98.8 F, pulse 70, respiration 18, BP 134/66 mmHg and O2 sat 9923 ceftriaxone 1 g IVPB, fosfomycin 3 g p.o. x 1, ketorolac  30 mg IVP, started on valacyclovir 1000 twice daily and Percocet 5/325 mg as needed. Ct abd pelvis w/: Bilateral pelvic lymphadenopathy, including necrotic inguinal nodes, as above. This appearance is likely reactive/infectious, but warrants attention on clinical follow-up. If persistent, consider tissue sampling.  ID was consulted and admitted Patient underwent LP 10/18-and placed on IV penicillin pending VDRL and FTA-ABS antibody  Subjective: Seen and examined Overnight patient remains afebrile hemodynamically stable She feels improved, rash clearing up  Assessment and plan:  Shotty Lymphadenopathy Vaginal discharge/lesions Annular with central clearing rash: Presentation concerning for syphilis with genital lesions as well as skin lesions.  Patient has been having floaters show LP was done to rule out neurosyphilis-placed on IV penicillin pending further labs.  CSF studies no growth so far.  VDRL nonreactive, FTA-ABS pending If both come back positive we will treat as neurosyphilis otherwise not.  Treponema  pallidum antibodies reactive She is homeless and cannot be discharged on IV antibiotics Continue doxycycline for other STI disease ID following, discussed today  HSV genital ulcers: Continue Valtrex tid x 7-10 days  Polysubstance abuse Bipolar 1 disorder Tobacco abuse: Cessation counseled.TOC consulted.Not in treatment for BPD.  Psychiatry consulted, added trazodone bedtime. TOC consulted to address SDOH,and barriers And outpatient referrals.  Leukocytosis: Hypokalemia: Resolved  DVT prophylaxis: SCDs Start: 03/14/24 0044 Code Status:   Code Status: Full Code Family Communication: plan of care discussed with patient at bedside. Patient status is: Remains hospitalized because of severity of illness Level of care: Telemetry   Dispo: The patient is from: homeless            Anticipated disposition: Pending FTA-ABS and ID clearance  Objective: Vitals last 24 hrs: Vitals:   03/17/24 2323 03/18/24 1331 03/18/24 2216 03/19/24 0513  BP: 114/68 134/73 128/72 114/66  Pulse: (!) 57 64 71 64  Resp: 18 17 19 16   Temp: 98.7 F (37.1 C) 98.3 F (36.8 C) 98.4 F (36.9 C) 98.2 F (36.8 C)  TempSrc: Oral Oral Oral Oral  SpO2: 97% 97% 98% 98%  Weight:      Height:        Physical Examination: General exam: AAOX3, not much interactive, prefers to be on bed all the time HEENT:Oral mucosa moist, Ear/Nose WNL grossly Respiratory system: CTA b/l  Cardiovascular system: S1 & S2 +, No JVD. Gastrointestinal system: Abdomen soft,NT,ND, BS+ Nervous System: Alert, awake, moving all extremities,and following commands. Extremities: extremities warm, leg edema neg Skin: Warm, faint rashes + on arms/thigh MSK: Normal muscle bulk,tone, power   Medications reviewed:  Scheduled Meds:  clotrimazole   Topical BID   doxycycline  100 mg Oral Q12H   traZODone  50 mg Oral QHS,MR X 1   valACYclovir  1,000 mg Oral BID   Continuous Infusions:  penicillin G potassium 24 Million Units in dextrose 5 %  500 mL CONTINUOUS infusion 24 Million Units (03/19/24 0247)   Diet: Diet Order             Diet regular Room service appropriate? Yes; Fluid consistency: Thin  Diet effective now                    Data Reviewed: I have personally reviewed following labs and imaging studies ( see epic result tab) CBC: Recent Labs  Lab 03/13/24 2043 03/14/24 0517 03/16/24 0613  WBC 13.2* 10.0 7.6  NEUTROABS 10.8* 7.9*  --   HGB 13.2 13.9 13.6  HCT 42.3 44.2 42.0  MCV 94.0 93.8 93.5  PLT 279 248 253   CMP: Recent Labs  Lab 03/13/24 2043 03/14/24 0517 03/16/24 0613  NA 139 140 139  K 4.0 3.4* 4.3  CL 105 105 104  CO2 26 27 25   GLUCOSE 74 128* 110*  BUN 12 11 15   CREATININE 0.72 0.81 0.66  CALCIUM 9.2 9.0 9.6  MG  --  2.2 2.0   GFR: Estimated Creatinine Clearance: 90 mL/min (by C-G formula based on SCr of 0.66 mg/dL). Recent Labs  Lab 03/13/24 2043 03/14/24 0517 03/16/24 0613  AST 16 17 30   ALT 7 9 26   ALKPHOS 73 62 63  BILITOT 0.2 <0.2 <0.2  PROT 6.6 6.7 7.0  ALBUMIN 3.8 3.7 3.8   No results for input(s): LIPASE, AMYLASE in the last 168 hours. No results for input(s): AMMONIA in the last 168 hours. Coagulation Profile: No results for input(s): INR, PROTIME in the last 168 hours. Unresulted Labs (From admission, onward)    None      Antimicrobials/Microbiology: Anti-infectives (From admission, onward)    Start     Dose/Rate Route Frequency Ordered Stop   03/17/24 1045  doxycycline (VIBRA-TABS) tablet 100 mg        100 mg Oral Every 12 hours 03/17/24 0952     03/15/24 1830  penicillin G potassium 24 Million Units in dextrose 5 % 500 mL CONTINUOUS infusion        24 Million Units 20.8 mL/hr over 24 Hours Intravenous Every 24 hours 03/15/24 1747     03/15/24 0000  cefTRIAXone (ROCEPHIN) 1 g in sodium chloride  0.9 % 100 mL IVPB  Status:  Discontinued        1 g 200 mL/hr over 30 Minutes Intravenous Every 24 hours 03/14/24 0802 03/17/24 0953   03/14/24 1800   doxycycline (VIBRA-TABS) tablet 100 mg  Status:  Discontinued        100 mg Oral Every 12 hours 03/14/24 1704 03/17/24 0940   03/14/24 0030  valACYclovir (VALTREX) tablet 1,000 mg        1,000 mg Oral 2 times daily 03/14/24 0016     03/14/24 0030  cefTRIAXone (ROCEPHIN) 1 g in sodium chloride  0.9 % 100 mL IVPB        1 g 200 mL/hr over 30 Minutes Intravenous  Once 03/14/24 0016 03/14/24 0135   03/13/24 2245  fosfomycin (MONUROL) packet 3 g        3 g Oral  Once 03/13/24 2239 03/13/24 2309         Component Value Date/Time   SDES  03/15/2024 1115    CSF Performed at [facility], [address] [place], [place] [address]    SPECREQUEST  03/15/2024 1115    NONE Performed at [facility], [address] [place], [place] [address]    CULT  03/15/2024 1115    NO GROWTH 3 DAYS Performed at [facility], [address] [place], [place] [address]    REPTSTATUS 03/19/2024 FINAL 03/15/2024 1115  Procedures:  Mennie LAMY, MD [facility] 03/19/2024, 2:54 PM

## 2024-03-19 NOTE — Plan of Care (Signed)
  Problem: Clinical Measurements: Goal: Ability to maintain clinical measurements within normal limits will improve Outcome: Progressing Goal: Will remain free from infection Outcome: Progressing Goal: Diagnostic test results will improve Outcome: Progressing   Problem: Coping: Goal: Level of anxiety will decrease Outcome: Progressing   Problem: Skin Integrity: Goal: Risk for impaired skin integrity will decrease Outcome: Progressing

## 2024-03-20 DIAGNOSIS — R102 Pelvic and perineal pain unspecified side: Secondary | ICD-10-CM

## 2024-03-20 NOTE — Plan of Care (Signed)

## 2024-03-20 NOTE — Progress Notes (Signed)
 PROGRESS NOTE Barbara Maynard  FMW:[id number] DOB: 04/21/1981 DOA: 03/13/2024 PCP: Patient, No Pcp Per  Brief Narrative/Hospital Course: Barbara Maynard is a 43 y.o. 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   Ct abd pelvis w/: Bilateral pelvic lymphadenopathy, including necrotic inguinal nodes, as above. This appearance is likely reactive/infectious, but warrants attention on clinical follow-up. If persistent, consider tissue sampling.  ID was consulted and admitted Patient underwent LP 10/18-and placed on IV penicillin pending VDRL and FTA-ABS antibody-unfortunately the FTA-ABS will take 3 to 5 days to return as has to be sent to [place]   Subjective: Says she is not feeling good in general  Assessment and plan:  Lymphadenopathy Vaginal discharge/lesions Annular with central clearing rash: Presentation concerning for syphilis with genital lesions as well as skin lesions.  Patient has been having floaters show LP was done to rule out neurosyphilis-placed on IV penicillin pending further labs.  CSF studies no growth so far.  VDRL nonreactive, FTA-ABS pending (suspect 3 to 5 days prior to return) If both come back positive we will treat as neurosyphilis otherwise not.  Treponema pallidum antibodies reactive She is homeless and cannot be discharged on IV antibiotics Continue doxycycline for other STI disease ID following  HSV genital ulcers: Continue Valtrex tid x 7-10 days  Polysubstance abuse Bipolar 1 disorder Tobacco abuse: Cessation counseled.TOC consulted.Not in treatment for BPD.  Psychiatry consulted, added trazodone bedtime. TOC consulted to address SDOH,and barriers And outpatient  referrals.  Leukocytosis: Hypokalemia: Resolved  DVT prophylaxis: SCDs Start: 03/14/24 0044 Code Status:   Code Status: Full Code Family Communication: plan of care discussed with patient at bedside. Patient status is: Remains hospitalized because of severity of illness Level of care: Telemetry   Dispo: The patient is from: homeless            Anticipated disposition: Pending FTA-ABS and plan for antibiotics  Objective: Vitals last 24 hrs: Vitals:   03/17/24 2323 03/18/24 1331 03/18/24 2216 03/19/24 0513  BP: 114/68 134/73 128/72 114/66  Pulse: (!) 57 64 71 64  Resp: 18 17 19 16   Temp: 98.7 F (37.1 C) 98.3 F (36.8 C) 98.4 F (36.9 C) 98.2 F (36.8 C)  TempSrc: Oral Oral Oral Oral  SpO2: 97% 97% 98% 98%  Weight:      Height:        Physical Examination:  General: Appearance:  Ill-appearing female in no acute distress     Lungs:     respirations unlabored  Heart:    Normal heart rate.    MS:   All extremities are intact.   Neurologic:   Awake, alert     Medications reviewed:  Scheduled Meds:  clotrimazole   Topical BID   doxycycline  100 mg Oral Q12H   traZODone  50 mg Oral QHS,MR X 1   valACYclovir  1,000 mg Oral BID   Continuous Infusions:  penicillin G potassium 24 Million Units in dextrose 5 % 500 mL CONTINUOUS infusion 24 Million Units (03/19/24 0247)          Data Reviewed: I have personally reviewed following labs and imaging studies ( see epic result tab) CBC: Recent Labs  Lab 03/13/24 2043 03/14/24 0517 03/16/24 0613  WBC 13.2* 10.0 7.6  NEUTROABS 10.8* 7.9*  --   HGB 13.2 13.9 13.6  HCT 42.3 44.2 42.0  MCV 94.0 93.8 93.5  PLT 279 248 253   CMP: Recent Labs  Lab 03/13/24 2043 03/14/24 0517 03/16/24 0613  NA 139 140 139  K 4.0 3.4* 4.3  CL 105 105 104  CO2 26 27 25   GLUCOSE 74 128* 110*  BUN 12 11 15   CREATININE 0.72 0.81 0.66  CALCIUM 9.2 9.0 9.6  MG  --  2.2 2.0   GFR: Estimated Creatinine Clearance: 90 mL/min (by C-G  formula based on SCr of 0.66 mg/dL). Recent Labs  Lab 03/13/24 2043 03/14/24 0517 03/16/24 0613  AST 16 17 30   ALT 7 9 26   ALKPHOS 73 62 63  BILITOT 0.2 <0.2 <0.2  PROT 6.6 6.7 7.0  ALBUMIN 3.8 3.7 3.8   No results for input(s): LIPASE, AMYLASE in the last 168 hours. No results for input(s): AMMONIA in the last 168 hours. Coagulation Profile: No results for input(s): INR, PROTIME in the last 168 hours. Unresulted Labs (From admission, onward)    None      Antimicrobials/Microbiology: Anti-infectives (From admission, onward)    Start     Dose/Rate Route Frequency Ordered Stop   03/17/24 1045  doxycycline (VIBRA-TABS) tablet 100 mg        100 mg Oral Every 12 hours 03/17/24 0952     03/15/24 1830  penicillin G potassium 24 Million Units in dextrose 5 % 500 mL CONTINUOUS infusion        24 Million Units 20.8 mL/hr over 24 Hours Intravenous Every 24 hours 03/15/24 1747     03/15/24 0000  cefTRIAXone (ROCEPHIN) 1 g in sodium chloride  0.9 % 100 mL IVPB  Status:  Discontinued        1 g 200 mL/hr over 30 Minutes Intravenous Every 24 hours 03/14/24 0802 03/17/24 0953   03/14/24 1800  doxycycline (VIBRA-TABS) tablet 100 mg  Status:  Discontinued        100 mg Oral Every 12 hours 03/14/24 1704 03/17/24 0940   03/14/24 0030  valACYclovir (VALTREX) tablet 1,000 mg        1,000 mg Oral 2 times daily 03/14/24 0016     03/14/24 0030  cefTRIAXone (ROCEPHIN) 1 g in sodium chloride  0.9 % 100 mL IVPB        1 g 200 mL/hr over 30 Minutes Intravenous  Once 03/14/24 0016 03/14/24 0135   03/13/24 2245  fosfomycin (MONUROL) packet 3 g        3 g Oral  Once 03/13/24 2239 03/13/24 2309         Component Value Date/Time   SDES  03/15/2024 1115    CSF Performed at [facility], [address], [place], [place] [address]    SPECREQUEST  03/15/2024 1115    NONE Performed at [facility], [address] [place], [place] [address]    CULT   03/15/2024 1115    NO GROWTH 3 DAYS Performed at [facility], [address], [place], [place] [address]    REPTSTATUS 03/19/2024 FINAL 03/15/2024 1115  Procedures: LP  Harlene RAYMOND Bowl, DO [facility] 03/20/2024, 10:51 AM

## 2024-03-20 NOTE — Plan of Care (Signed)
  Problem: Education: Goal: Knowledge of General Education information will improve Description: Including pain rating scale, medication(s)/side effects and non-pharmacologic comfort measures Outcome: Progressing   Problem: Health Behavior/Discharge Planning: Goal: Ability to manage health-related needs will improve Outcome: Progressing   Problem: Clinical Measurements: Goal: Ability to maintain clinical measurements within normal limits will improve Outcome: Progressing Goal: Respiratory complications will improve Outcome: Progressing   Problem: Clinical Measurements: Goal: Will remain free from infection Outcome: Not Progressing Goal: Diagnostic test results will improve Outcome: Not Progressing

## 2024-03-20 NOTE — Plan of Care (Signed)

## 2024-03-21 MED ORDER — PENICILLIN G BENZATHINE 1200000 UNIT/2ML IM SUSY
2.4000 10*6.[IU] | PREFILLED_SYRINGE | Freq: Once | INTRAMUSCULAR | Status: AC
  Administered 2024-03-26: 2.4 10*6.[IU] via INTRAMUSCULAR
  Filled 2024-03-21: qty 4

## 2024-03-21 MED ORDER — NICOTINE 21 MG/24HR TD PT24
21.0000 mg | MEDICATED_PATCH | Freq: Every day | TRANSDERMAL | Status: DC
  Administered 2024-03-21 – 2024-03-26 (×6): 21 mg via TRANSDERMAL
  Filled 2024-03-21 (×6): qty 1

## 2024-03-21 NOTE — Progress Notes (Signed)
 PROGRESS NOTE Barbara Maynard  FMW:[id number] DOB: June 06, 1980 DOA: 03/13/2024 PCP: Patient, No Pcp Per  Brief Narrative/Hospital Course: Barbara Maynard is a 43 y.o. 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   Ct abd pelvis w/: Bilateral pelvic lymphadenopathy, including necrotic inguinal nodes, as above. This appearance is likely reactive/infectious, but warrants attention on clinical follow-up. If persistent, consider tissue sampling.  ID was consulted and admitted Patient underwent LP 10/18-and placed on IV penicillin pending VDRL and FTA-ABS antibody-unfortunately the FTA-ABS will take 3 to 5 days to return as has to be sent to [place]   Subjective: Requesting to see Behavioral health, she thinks she is getting depressed.  Assessment and plan:  Lymphadenopathy Vaginal discharge/lesions Annular with central clearing rash: Presentation concerning for syphilis with genital lesions as well as skin lesions.  Patient has been having floaters show LP was done to rule out neurosyphilis-placed on IV penicillin pending further labs.  CSF studies no growth so far.  VDRL nonreactive, FTA-ABS pending (suspect 3 to 5 days prior to return) If both come back positive we will treat as neurosyphilis otherwise not.  Treponema pallidum antibodies reactive She is homeless and cannot be discharged on IV antibiotics Continue doxycycline for other STI disease ID following  HSV genital ulcers: Continue Valtrex tid x 7-10 days  Polysubstance abuse Bipolar 1 disorder Tobacco abuse: Cessation counseled.TOC consulted.Not in treatment for BPD.  Psychiatry re consulted, added trazodone bedtime. TOC consulted to address SDOH,and  barriers And outpatient referrals.  Leukocytosis: Hypokalemia: Resolved  DVT prophylaxis: SCDs Start: 03/14/24 0044 Code Status:   Code Status: Full Code Family Communication: plan of care discussed with patient at bedside. Patient status is: Remains hospitalized because of severity of illness Level of care: Telemetry   Dispo: The patient is from: homeless            Anticipated disposition: Pending FTA-ABS and plan for antibiotics  Objective:    03/21/2024    4:45 AM 03/20/2024    7:41 PM 03/20/2024    2:29 PM  Vitals with BMI  Systolic 99 118 118  Diastolic 59 57 60  Pulse 58 62 60      Physical Examination: General exam: alert awake,  HEENT:Oral mucosa moist, Ear/Nose WNL grossly Respiratory system:CTAB Cardiovascular system: S1 & S2 +, No JVD. Gastrointestinal system: Abdomen soft Nervous System: Alert, awake Extremities: No edema, clubbing Skin: Rash resolving MSK: Normal muscle bulk,tone, power       Medications reviewed:  Scheduled Meds:  clotrimazole   Topical BID   doxycycline  100 mg Oral Q12H   traZODone  50 mg Oral QHS,MR X 1   valACYclovir  1,000 mg Oral BID   Continuous Infusions:  penicillin G potassium 24 Million Units in dextrose 5 % 500 mL CONTINUOUS infusion 24 Million Units (03/19/24 0247)          Data Reviewed: I have personally reviewed following labs and imaging studies ( see epic result tab) CBC: Recent Labs  Lab 03/16/24 0613  WBC 7.6  HGB 13.6  HCT 42.0  MCV 93.5  PLT 253   CMP: Recent Labs  Lab 03/16/24 0613  NA 139  K 4.3  CL 104  CO2 25  GLUCOSE 110*  BUN 15  CREATININE 0.66  CALCIUM 9.6  MG 2.0   GFR: Estimated Creatinine Clearance: 90 mL/min (by C-G formula based on SCr of 0.66 mg/dL). Recent Labs  Lab 03/16/24 0613  AST 30  ALT 26  ALKPHOS 63  BILITOT <0.2  PROT 7.0  ALBUMIN 3.8   No results for input(s): LIPASE, AMYLASE in the last 168 hours. No results for input(s): AMMONIA in the last  168 hours. Coagulation Profile: No results for input(s): INR, PROTIME in the last 168 hours. Unresulted Labs (From admission, onward)    None      Antimicrobials/Microbiology: Anti-infectives (From admission, onward)    Start     Dose/Rate Route Frequency Ordered Stop   03/17/24 1045  doxycycline (VIBRA-TABS) tablet 100 mg        100 mg Oral Every 12 hours 03/17/24 0952     03/15/24 1830  penicillin G potassium 24 Million Units in dextrose 5 % 500 mL CONTINUOUS infusion        24 Million Units 20.8 mL/hr over 24 Hours Intravenous Every 24 hours 03/15/24 1747     03/15/24 0000  cefTRIAXone (ROCEPHIN) 1 g in sodium chloride  0.9 % 100 mL IVPB  Status:  Discontinued        1 g 200 mL/hr over 30 Minutes Intravenous Every 24 hours 03/14/24 0802 03/17/24 0953   03/14/24 1800  doxycycline (VIBRA-TABS) tablet 100 mg  Status:  Discontinued        100 mg Oral Every 12 hours 03/14/24 1704 03/17/24 0940   03/14/24 0030  valACYclovir (VALTREX) tablet 1,000 mg        1,000 mg Oral 2 times daily 03/14/24 0016     03/14/24 0030  cefTRIAXone (ROCEPHIN) 1 g in sodium chloride  0.9 % 100 mL IVPB        1 g 200 mL/hr over 30 Minutes Intravenous  Once 03/14/24 0016 03/14/24 0135   03/13/24 2245  fosfomycin (MONUROL) packet 3 g        3 g Oral  Once 03/13/24 2239 03/13/24 2309         Component Value Date/Time   SDES  03/15/2024 1115    CSF Performed at [facility], [address], [place], [place] [address]    SPECREQUEST  03/15/2024 1115    NONE Performed at [facility], [address] [place], [place] [address]    CULT  03/15/2024 1115    NO GROWTH 3 DAYS Performed at [facility], [address], [place], [place] [address]    REPTSTATUS 03/19/2024 FINAL 03/15/2024 1115  Procedures: LP  Landon BRAVO Barbara Mcgovern, DO [facility] 03/21/2024, 9:24 AM

## 2024-03-21 NOTE — Progress Notes (Addendum)
 Date of Admission:  03/13/2024      ID: Barbara Maynard is a 43 y.o. 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   Pelvic lymphadenopathy   Bipolar 1 disorder (HCC)   Polysubstance abuse (HCC)   Tobacco use   Hypokalemia   Rash in adult   Secondary syphilis    Subjective: pt is doing well  Medications:   clotrimazole   Topical BID   doxycycline  100 mg Oral Q12H   traZODone  50 mg Oral QHS,MR X 1   valACYclovir  1,000 mg Oral BID    Objective: Vital signs in last 24 hours: Patient Vitals for the past 24 hrs:  BP Temp Temp src Pulse Resp SpO2  03/21/24 0445 (!) 99/59 97.8 F (36.6 C) Oral (!) 58 18 99 %  03/20/24 1941 (!) 118/57 98.7 F (37.1 C) Oral 62 18 98 %  03/20/24 1429 118/60 98.4 F (36.9 C) Oral 60 20 99 %     PHYSICAL EXAM:  General: Alert, cooperative, no distress, appears stated age.  Lungs: Clear to auscultation bilaterally. No Wheezing or Rhonchi. No rales. Heart: Regular rate and rhythm, no murmur, rub or gallop. Abdomen: Soft, non-tender,not distended. Bowel sounds normal. No masses Genital  ulcer resolved Extremities: atraumatic, no cyanosis. No edema. No clubbing Skin: Rash resolved Shotty inguinal lymph nodes almost resolved Lymph: Cervical, supraclavicular normal. Neurologic: Grossly non-focal  Lab Results    Latest Ref Rng & Units 03/16/2024    6:13 AM 03/14/2024    5:17 AM 03/13/2024    8:43 PM  CBC  WBC 4.0 - 10.5 K/uL 7.6  10.0  13.2   Hemoglobin 12.0 - 15.0 g/dL 86.3  86.0  86.7   Hematocrit 36.0 - 46.0 % 42.0  44.2  42.3   Platelets 150 - 400 K/uL 253  248  279        Latest Ref Rng & Units 03/16/2024    6:13 AM 03/14/2024    5:17 AM 03/13/2024    8:43 PM  CMP  Glucose 70 - 99 mg/dL 889  871  74   BUN 6 - 20 mg/dL 15  11  12    Creatinine 0.44 - 1.00 mg/dL 9.33  9.18  9.27   Sodium 135 - 145 mmol/L 139  140  139   Potassium 3.5 - 5.1 mmol/L 4.3  3.4  4.0   Chloride 98 -  111 mmol/L 104  105  105   CO2 22 - 32 mmol/L 25  27  26    Calcium 8.9 - 10.3 mg/dL 9.6  9.0  9.2   Total Protein 6.5 - 8.1 g/dL 7.0  6.7  6.6   Total Bilirubin 0.0 - 1.2 mg/dL <9.7  <9.7  0.2   Alkaline Phos 38 - 126 U/L 63  62  73   AST 15 - 41 U/L 30  17  16    ALT 0 - 44 U/L 26  9  7        Microbiology: CSF VDRL CSF FTA-ABS pending    Assessment/Plan: Syphilis - genital lesions as well as skin lesions - resolved has been having floaters and so a lumbar puncture was done and it showed 2 WBCs and 54 protein and hence being treated like neurosyphilis. VDRL and FTA-ABS test have been sent.  VDRL neg- FTA-ABS still pending- delay due to miscoding  Currently she is on IV penicillin and end date 03/25/24  (  10 days) if FT-ABS neg then can stop . Benzathine penicillin one dose to be given on 03/26/24  Patient is also on doxycycline for other sexually transmitted diseases like.  LGV- will complete 21 days Patient is homeless She cannot be discharged with IV antibiotics.  Valtrext 1000mg  BID -completed 7 days- can DC  HIV nonreactive- she is interested in PreP- - check insurance coverage for truvada or descovy next week- she will have to follow up with [facility]   Discussed the management with the patient and ID pharmacist If needed Dr.Manandhar can follow her on Monday

## 2024-03-21 NOTE — Plan of Care (Signed)

## 2024-03-21 NOTE — Plan of Care (Signed)
  Problem: Education: Goal: Knowledge of General Education information will improve Description: Including pain rating scale, medication(s)/side effects and non-pharmacologic comfort measures Outcome: Progressing   Problem: Clinical Measurements: Goal: Diagnostic test results will improve Outcome: Progressing Goal: Respiratory complications will improve Outcome: Progressing   Problem: Health Behavior/Discharge Planning: Goal: Ability to manage health-related needs will improve Outcome: Not Progressing   Problem: Clinical Measurements: Goal: Ability to maintain clinical measurements within normal limits will improve Outcome: Not Progressing Goal: Will remain free from infection Outcome: Not Progressing

## 2024-03-22 DIAGNOSIS — F319 Bipolar disorder, unspecified: Secondary | ICD-10-CM

## 2024-03-22 DIAGNOSIS — A539 Syphilis, unspecified: Secondary | ICD-10-CM

## 2024-03-22 DIAGNOSIS — E876 Hypokalemia: Secondary | ICD-10-CM

## 2024-03-22 DIAGNOSIS — A523 Neurosyphilis, unspecified: Secondary | ICD-10-CM

## 2024-03-22 MED ORDER — FLUCONAZOLE 100 MG PO TABS
100.0000 mg | ORAL_TABLET | Freq: Every day | ORAL | Status: AC
  Administered 2024-03-22 – 2024-03-24 (×3): 100 mg via ORAL
  Filled 2024-03-22 (×3): qty 1

## 2024-03-22 MED ORDER — ALBUTEROL SULFATE HFA 108 (90 BASE) MCG/ACT IN AERS: 1.0000 | INHALATION_SPRAY | Freq: Four times a day (QID) | RESPIRATORY_TRACT | Status: DC | PRN

## 2024-03-22 MED ORDER — ALBUTEROL SULFATE (2.5 MG/3ML) 0.083% IN NEBU
2.5000 mg | INHALATION_SOLUTION | Freq: Four times a day (QID) | RESPIRATORY_TRACT | Status: DC | PRN
  Administered 2024-03-22: 2.5 mg via RESPIRATORY_TRACT
  Filled 2024-03-22 (×2): qty 3

## 2024-03-22 NOTE — Plan of Care (Signed)

## 2024-03-22 NOTE — Progress Notes (Signed)
 RT called to 1603, pt wanted an inhaler to keep at bedside, order has been discontinued for the Ventolin, pt declined an Albuterol neb tx @ this time. No respiratory distress noted.

## 2024-03-22 NOTE — Plan of Care (Signed)
  Problem: Education: Goal: Knowledge of General Education information will improve Description: Including pain rating scale, medication(s)/side effects and non-pharmacologic comfort measures Outcome: Progressing   Problem: Health Behavior/Discharge Planning: Goal: Ability to manage health-related needs will improve Outcome: Progressing   Problem: Clinical Measurements: Goal: Cardiovascular complication will be avoided Outcome: Progressing   Problem: Clinical Measurements: Goal: Ability to maintain clinical measurements within normal limits will improve Outcome: Not Progressing Goal: Will remain free from infection Outcome: Not Progressing Goal: Diagnostic test results will improve Outcome: Not Progressing Goal: Respiratory complications will improve Outcome: Not Progressing

## 2024-03-22 NOTE — Progress Notes (Signed)
 PROGRESS NOTE Barbara Maynard  FMW:[id number] DOB: 06/07/1980 DOA: 03/13/2024 PCP: Patient, No Pcp Per  Brief Narrative/Hospital Course: Barbara Maynard is a 43 y.o. 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   Ct abd pelvis w/: Bilateral pelvic lymphadenopathy, including necrotic inguinal nodes, as above. This appearance is likely reactive/infectious, but warrants attention on clinical follow-up. If persistent, consider tissue sampling.  ID was consulted and admitted Patient underwent LP 10/18-and placed on IV penicillin pending VDRL and FTA-ABS antibody-unfortunately the FTA-ABS will take 3 to 5 days to return as has to be sent to [place]   Subjective: Requesting to see Behavioral health, she thinks she is getting depressed.  Assessment and plan:  Lymphadenopathy Vaginal discharge/lesions Annular with central clearing rash: Presentation concerning for syphilis with genital lesions as well as skin lesions.  Patient has been having floaters show LP was done to rule out neurosyphilis-placed on IV penicillin pending further labs.  CSF studies no growth so far.  VDRL nonreactive, FTA-ABS pending (suspect 3 to 5 days prior to return) If both come back positive we will treat as neurosyphilis otherwise not.  Treponema pallidum antibodies reactive She is homeless and cannot be discharged on IV antibiotics.  Continue IV penicillin with end date of 03/25/2024 (10 days), if FT-ABS negative then can be stopped.  Benzathine penicillin G 1 dose to be given on 03/26/2024 Continue doxycycline for other STI disease like LGV-to complete 21 days course  HSV genital ulcers: Continue Valtrex tid-completed 7 days course and  stopped  Polysubstance abuse Bipolar 1 disorder Tobacco abuse: Cessation counseled.TOC consulted.Not in treatment for BPD.  Psychiatry re consulted, added trazodone bedtime. TOC consulted to address SDOH,and barriers And outpatient referrals.  Leukocytosis: Hypokalemia: Resolved  DVT prophylaxis: SCDs Start: 03/14/24 0044 Code Status:   Code Status: Full Code Family Communication: plan of care discussed with patient at bedside. Patient status is: Remains hospitalized because of severity of illness Level of care: Telemetry   Dispo: The patient is from: homeless            Anticipated disposition: Pending FTA-ABS and plan for antibiotics  Objective:    03/22/2024    5:32 AM 03/22/2024    4:18 AM 03/21/2024    8:14 PM  Vitals with BMI  Weight  155 lbs 14 oz   BMI  24.41   Systolic 103  121  Diastolic 45  74  Pulse 51  68      Physical Examination: General exam: alert awake,  HEENT:Oral mucosa moist, Ear/Nose WNL grossly Respiratory system:CTAB Cardiovascular system: S1 & S2 +, No JVD. Gastrointestinal system: Abdomen soft Nervous System: Alert, awake Extremities: No edema, clubbing Skin: Rash resolving MSK: Normal muscle bulk,tone, power       Medications reviewed:  Scheduled Meds:  clotrimazole   Topical BID   doxycycline  100 mg Oral Q12H   traZODone  50 mg Oral QHS,MR X 1   valACYclovir  1,000 mg Oral BID   Continuous Infusions:  penicillin G potassium 24 Million Units in dextrose 5 % 500 mL CONTINUOUS infusion 24 Million Units (03/19/24 0247)          Data Reviewed: I have personally reviewed following labs and imaging studies ( see epic result tab) CBC: Recent  Labs  Lab 03/16/24 0613  WBC 7.6  HGB 13.6  HCT 42.0  MCV 93.5  PLT 253   CMP: Recent Labs  Lab 03/16/24 0613  NA 139  K 4.3  CL 104  CO2 25  GLUCOSE 110*  BUN 15  CREATININE 0.66  CALCIUM 9.6  MG 2.0   GFR: Estimated Creatinine Clearance: 90 mL/min (by C-G formula based  on SCr of 0.66 mg/dL). Recent Labs  Lab 03/16/24 0613  AST 30  ALT 26  ALKPHOS 63  BILITOT <0.2  PROT 7.0  ALBUMIN 3.8   No results for input(s): LIPASE, AMYLASE in the last 168 hours. No results for input(s): AMMONIA in the last 168 hours. Coagulation Profile: No results for input(s): INR, PROTIME in the last 168 hours. Unresulted Labs (From admission, onward)    None      Antimicrobials/Microbiology: Anti-infectives (From admission, onward)    Start     Dose/Rate Route Frequency Ordered Stop   03/26/24 0800  penicillin g benzathine (BICILLIN LA) 1200000 UNIT/2ML injection 2.4 Million Units        2.4 Million Units Intramuscular  Once 03/21/24 1422     03/22/24 1100  fluconazole (DIFLUCAN) tablet 100 mg        100 mg Oral Daily 03/22/24 1023 03/25/24 0959   03/17/24 1045  doxycycline (VIBRA-TABS) tablet 100 mg        100 mg Oral Every 12 hours 03/17/24 0952 04/04/24 2359   03/15/24 1830  penicillin G potassium 24 Million Units in dextrose 5 % 500 mL CONTINUOUS infusion        24 Million Units 20.8 mL/hr over 24 Hours Intravenous Every 24 hours 03/15/24 1747 03/26/24 0359   03/15/24 0000  cefTRIAXone (ROCEPHIN) 1 g in sodium chloride  0.9 % 100 mL IVPB  Status:  Discontinued        1 g 200 mL/hr over 30 Minutes Intravenous Every 24 hours 03/14/24 0802 03/17/24 0953   03/14/24 1800  doxycycline (VIBRA-TABS) tablet 100 mg  Status:  Discontinued        100 mg Oral Every 12 hours 03/14/24 1704 03/17/24 0940   03/14/24 0030  valACYclovir (VALTREX) tablet 1,000 mg        1,000 mg Oral 2 times daily 03/14/24 0016 03/21/24 2124   03/14/24 0030  cefTRIAXone (ROCEPHIN) 1 g in sodium chloride  0.9 % 100 mL IVPB        1 g 200 mL/hr over 30 Minutes Intravenous  Once 03/14/24 0016 03/14/24 0135   03/13/24 2245  fosfomycin (MONUROL) packet 3 g        3 g Oral  Once 03/13/24 2239 03/13/24 2309         Component Value Date/Time   SDES  03/15/2024 1115    CSF Performed at  [facility], [address], [place], [place] [address]    SPECREQUEST  03/15/2024 1115    NONE Performed at [facility], [address], [place], [place] [address]    CULT  03/15/2024 1115    NO GROWTH 3 DAYS Performed at [facility], [address] [place], [place] [address]    REPTSTATUS 03/19/2024 FINAL 03/15/2024 1115  Procedures: LP Time spent 35 minutes Cresencio Fairly, MD [facility] 03/22/2024, 11:46 AM

## 2024-03-23 LAB — CBC WITH DIFFERENTIAL/PLATELET
Abs Immature Granulocytes: 0.08 K/uL — ABNORMAL HIGH (ref 0.00–0.07)
Basophils Absolute: 0.1 K/uL (ref 0.0–0.1)
Basophils Relative: 1 %
Eosinophils Absolute: 0.2 K/uL (ref 0.0–0.5)
Eosinophils Relative: 2 %
HCT: 42.9 % (ref 36.0–46.0)
Hemoglobin: 13.1 g/dL (ref 12.0–15.0)
Immature Granulocytes: 1 %
Lymphocytes Relative: 28 %
Lymphs Abs: 2.2 K/uL (ref 0.7–4.0)
MCH: 28.9 pg (ref 26.0–34.0)
MCHC: 30.5 g/dL (ref 30.0–36.0)
MCV: 94.7 fL (ref 80.0–100.0)
Monocytes Absolute: 0.6 K/uL (ref 0.1–1.0)
Monocytes Relative: 8 %
Neutro Abs: 4.7 K/uL (ref 1.7–7.7)
Neutrophils Relative %: 60 %
Platelets: 275 K/uL (ref 150–400)
RBC: 4.53 MIL/uL (ref 3.87–5.11)
RDW: 13.1 % (ref 11.5–15.5)
WBC: 7.9 K/uL (ref 4.0–10.5)
nRBC: 0 % (ref 0.0–0.2)

## 2024-03-23 LAB — BASIC METABOLIC PANEL WITH GFR
Anion gap: 11 (ref 5–15)
BUN: 24 mg/dL — ABNORMAL HIGH (ref 6–20)
CO2: 24 mmol/L (ref 22–32)
Calcium: 9.8 mg/dL (ref 8.9–10.3)
Chloride: 103 mmol/L (ref 98–111)
Creatinine, Ser: 0.75 mg/dL (ref 0.44–1.00)
GFR, Estimated: >60 mL/min (ref >60)
Glucose, Bld: 91 mg/dL (ref 70–99)
Potassium: 4 mmol/L (ref 3.5–5.1)
Sodium: 137 mmol/L (ref 135–145)

## 2024-03-23 MED ORDER — ALBUTEROL SULFATE (2.5 MG/3ML) 0.083% IN NEBU: 3.0000 mL | INHALATION_SOLUTION | RESPIRATORY_TRACT | Status: DC | PRN

## 2024-03-23 MED ORDER — METRONIDAZOLE 0.75 % EX GEL
Freq: Two times a day (BID) | CUTANEOUS | Status: DC
Start: 2024-03-23 — End: 2024-03-26
  Administered 2024-03-24: 1 via TOPICAL

## 2024-03-23 MED ORDER — ALBUTEROL SULFATE HFA 108 (90 BASE) MCG/ACT IN AERS
1.0000 | INHALATION_SPRAY | RESPIRATORY_TRACT | Status: DC | PRN
  Filled 2024-03-23: qty 6.7

## 2024-03-23 MED ORDER — METRONIDAZOLE 0.75 % EX GEL
Freq: Two times a day (BID) | CUTANEOUS | Status: DC
  Filled 2024-03-23: qty 45

## 2024-03-23 NOTE — Progress Notes (Signed)
 PROGRESS NOTE    Waneda Klammer  FMW:[id number]  DOB: 03-Jun-1980  DOA: 03/13/2024 PCP: Patient, No Pcp Per Outpatient Specialists:   Hospital course:  43 year old female who is homeless and has polysubstance use was admitted with lymphadenopathy, annular rash with central clearing and vaginal discharge.  RPR and antibodies for Treponema pallidum were positive.  She underwent an LP which ruled out neurosyphilis.  She is on IV penicillin for 10 days to treat secondary syphilis.  Subjective:  Patient states she is doing okay.  Is requesting her as needed nebulizer to be changed to inhaler and is requesting something for her rosacea.  Otherwise she is comfortable.   Objective: Vitals:   03/22/24 2020 03/22/24 2107 03/23/24 0518 03/23/24 1457  BP:  123/70 (!) 103/56 130/73  Pulse:  64 (!) 54 72  Resp:  18 18 16   Temp:  98.3 F (36.8 C) 98.2 F (36.8 C) 98.6 F (37 C)  TempSrc:  Oral Oral Oral  SpO2: 98% 96% 98% 100%  Weight:   64.2 kg   Height:        Intake/Output Summary (Last 24 hours) at 03/23/2024 1854 Last data filed at 03/23/2024 1300 Gross per 24 hour  Intake 480 ml  Output --  Net 480 ml   Filed Weights   03/16/24 0500 03/22/24 0418 03/23/24 0518  Weight: 66.4 kg 70.7 kg 64.2 kg     Exam:  General: Chronically ill-appearing female looking older than stated age sitting up in bed chatting on the phone and NAD. Eyes: sclera anicteric, conjuctiva mild injection bilaterally CVS: S1-S2, regular  Respiratory:  CTA GI: NABS, soft, NT  LE: Warm and well-perfusedl.  Psych: patient is logical and coherent, judgement and insight appear normal, mood and affect appropriate to situation.  Data Reviewed:  Basic Metabolic Panel: Recent Labs  Lab 03/23/24 0642  NA 137  K 4.0  CL 103  CO2 24  GLUCOSE 91  BUN 24*  CREATININE 0.75  CALCIUM 9.8    CBC: Recent Labs  Lab 03/23/24 0642  WBC 7.9  NEUTROABS 4.7  HGB 13.1  HCT 42.9  MCV 94.7  PLT 275      Scheduled Meds:  clotrimazole   Topical BID   doxycycline  100 mg Oral Q12H   fluconazole  100 mg Oral Daily   metroNIDAZOLE    Topical BID   nicotine  21 mg Transdermal Daily   [START ON 03/26/2024] penicillin g benzathine (BICILLIN-LA) IM  2.4 Million Units Intramuscular Once   traZODone  50 mg Oral QHS,MR X 1   Continuous Infusions:  penicillin G potassium 24 Million Units in dextrose 5 % 500 mL CONTINUOUS infusion Stopped (03/23/24 0406)     Assessment & Plan:   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 Patient is on day 8 of a 10-day course of IV penicillin She is homeless and cannot be discharged on IV antibiotics IV penicillin will and on 03/25/2024's to complete a 10-day course Benzathine penicillin G dose to be given on 03/26/2024 Continue doxycycline for possible LGV  HSV genital ulcers Patient completed a 7-day course of valacyclovir  Bipolar 1 disorder Polysubstance abuse Patient is homeless Psychiatry has been consulted for bipolar disorder Patient is not in acute mania or depression Trazodone was added per their recommendations TOC is consulted Patient seems to be in precontemplation regarding her polysubstance use     DVT prophylaxis: SCD Code Status: Full Family Communication: None today     Studies: No results  found.  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

## 2024-03-23 NOTE — Plan of Care (Signed)
  Problem: Education: Goal: Knowledge of General Education information will improve Description: Including pain rating scale, medication(s)/side effects and non-pharmacologic comfort measures Outcome: Progressing   Problem: Health Behavior/Discharge Planning: Goal: Ability to manage health-related needs will improve Outcome: Progressing   Problem: Clinical Measurements: Goal: Ability to maintain clinical measurements within normal limits will improve Outcome: Progressing Goal: Cardiovascular complication will be avoided Outcome: Progressing   Problem: Activity: Goal: Risk for activity intolerance will decrease Outcome: Progressing   Problem: Clinical Measurements: Goal: Will remain free from infection Outcome: Not Progressing Goal: Diagnostic test results will improve Outcome: Not Progressing Goal: Respiratory complications will improve Outcome: Not Progressing

## 2024-03-23 NOTE — Plan of Care (Signed)

## 2024-03-24 MED ORDER — MEDROXYPROGESTERONE ACETATE 150 MG/ML IM SUSP
150.0000 mg | INTRAMUSCULAR | Status: AC | PRN
Start: 2024-03-24 — End: 2024-03-26
  Administered 2024-03-26: 150 mg via INTRAMUSCULAR
  Filled 2024-03-24: qty 1

## 2024-03-24 MED ORDER — BACID PO TABS: 2.0000 | ORAL_TABLET | Freq: Two times a day (BID) | ORAL | Status: DC

## 2024-03-24 MED ORDER — FLUOXETINE HCL 10 MG PO CAPS
10.0000 mg | ORAL_CAPSULE | Freq: Every day | ORAL | Status: DC
  Administered 2024-03-24 – 2024-03-26 (×3): 10 mg via ORAL
  Filled 2024-03-24 (×3): qty 1

## 2024-03-24 MED ORDER — ADULT MULTIVITAMIN W/MINERALS CH
1.0000 | ORAL_TABLET | Freq: Every day | ORAL | Status: DC
  Administered 2024-03-24 – 2024-03-26 (×3): 1 via ORAL
  Filled 2024-03-24 (×3): qty 1

## 2024-03-24 MED ORDER — RISAQUAD PO CAPS
2.0000 | ORAL_CAPSULE | Freq: Two times a day (BID) | ORAL | Status: DC
  Administered 2024-03-24 – 2024-03-26 (×5): 2 via ORAL
  Filled 2024-03-24 (×5): qty 2

## 2024-03-24 MED ORDER — TRAZODONE HCL 50 MG PO TABS
100.0000 mg | ORAL_TABLET | Freq: Every day | ORAL | Status: DC
  Administered 2024-03-24 – 2024-03-25 (×2): 100 mg via ORAL
  Filled 2024-03-24 (×2): qty 2

## 2024-03-24 NOTE — Plan of Care (Signed)

## 2024-03-24 NOTE — Progress Notes (Signed)
  PROGRESS NOTE Barbara Maynard  FMW:[id number]  DOB: [birth year]  DOA: 03/13/2024 PCP: Patient, No Pcp Per  Hospital course: Barbara Maynard is a 43 year old female who is homeless and has polysubstance use was admitted with lymphadenopathy, annular rash with central clearing and vaginal discharge.  RPR and antibodies for Treponema pallidum were positive.  She underwent an LP which ruled out neurosyphilis.  She is on IV penicillin for 10 days to treat secondary syphilis.  Subjective: Pt reports poorly controlled anxiety. Otherwise feeling well. Wants to speak to social worker about resources post-discharge.   Objective: Vitals:   03/23/24 0518 03/23/24 1457 03/23/24 2102 03/24/24 0537  BP: (!) 103/56 130/73 (!) 131/99 (!) 109/42  Pulse: (!) 54 72 70 (!) 45  Resp: 18 16 16 18   Temp: 98.2 F (36.8 C) 98.6 F (37 C) 98.2 F (36.8 C) 98 F (36.7 C)  TempSrc: Oral Oral Oral Oral  SpO2: 98% 100% 100% 100%  Weight: 64.2 kg     Height:       Exam:  General: Chronically ill-appearing female looking older than stated age sitting up in bed and NAD. Eyes: sclera anicteric, conjuctiva mild injection bilaterally CVS: S1-S2, regular  Respiratory:  normal effort LE: Warm and well-perfusedl.  Psych: patient is logical and coherent, judgement and insight appear normal, mood and affect appropriate to situation.  Data Reviewed:  Basic Metabolic Panel: Recent Labs  Lab 03/23/24 0642  NA 137  K 4.0  CL 103  CO2 24  GLUCOSE 91  BUN 24*  CREATININE 0.75  CALCIUM 9.8    CBC: Recent Labs  Lab 03/23/24 0642  WBC 7.9  NEUTROABS 4.7  HGB 13.1  HCT 42.9  MCV 94.7  PLT 275   Assessment & Plan:   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 - ID has been consulted and treatment as recommended: Currently she is on IV penicillin and end date 03/25/24  ( 10 days) if FT-ABS neg then can stop. Benzathine penicillin one dose to be given on 03/26/24 Continue  doxycycline for possible LGV  HSV genital ulcers Patient completed a 7-day course of valacyclovir Requests depo shot prior to dc  Bipolar 1 disorder Psychiatry has been consulted Patient is not in acute mania or depression Trazodone was added per their recommendations - she states she has been on fluoxetine since high school and was not continued at admission. Will restart since she has tolerated chronically well.   Polysubstance use Un-housed  TOC is consulted Patient seems to be in precontemplation regarding her polysubstance use Multivitamin   DVT prophylaxis: SCD Code Status: Full Family Communication: None today   Barbara LITTIE Piety, DO [facility]  If 7PM-7AM, please contact night-coverage [email or web address]   LOS: 8 days

## 2024-03-24 NOTE — Plan of Care (Signed)
  Problem: Education: Goal: Knowledge of General Education information will improve Description: Including pain rating scale, medication(s)/side effects and non-pharmacologic comfort measures Outcome: Progressing   Problem: Clinical Measurements: Goal: Ability to maintain clinical measurements within normal limits will improve Outcome: Progressing Goal: Respiratory complications will improve Outcome: Progressing Goal: Cardiovascular complication will be avoided Outcome: Progressing   Problem: Activity: Goal: Risk for activity intolerance will decrease Outcome: Progressing   Problem: Health Behavior/Discharge Planning: Goal: Ability to manage health-related needs will improve Outcome: Not Progressing   Problem: Clinical Measurements: Goal: Will remain free from infection Outcome: Not Progressing Goal: Diagnostic test results will improve Outcome: Not Progressing

## 2024-03-25 ENCOUNTER — Other Ambulatory Visit (HOSPITAL_COMMUNITY): Payer: Self-pay

## 2024-03-25 ENCOUNTER — Telehealth: Payer: Self-pay

## 2024-03-25 NOTE — Plan of Care (Signed)
  Problem: Activity: Goal: Risk for activity intolerance will decrease Outcome: Progressing   Problem: Nutrition: Goal: Adequate nutrition will be maintained Outcome: Progressing   Problem: Coping: Goal: Level of anxiety will decrease Outcome: Progressing   Problem: Pain Managment: Goal: General experience of comfort will improve and/or be controlled Outcome: Progressing   Problem: Skin Integrity: Goal: Risk for impaired skin integrity will decrease Outcome: Progressing

## 2024-03-25 NOTE — Progress Notes (Signed)
  PROGRESS NOTE Barbara Maynard  FMW:[id number]  DOB: 1980/08/03  DOA: 03/13/2024 PCP: Patient, No Pcp Per  Hospital course: Barbara Maynard is a 43 year old female who is homeless and has polysubstance use was admitted with lymphadenopathy, annular rash with central clearing and vaginal discharge.  RPR and antibodies for Treponema pallidum were positive.  She underwent an LP which ruled out neurosyphilis.  She is on IV penicillin for 10 days to treat secondary syphilis.  Subjective: Pt without complaints today.   Objective: Vitals:   03/24/24 1428 03/24/24 2133 03/25/24 0553 03/25/24 0600  BP: 121/89 (!) 119/91 (!) 111/48   Pulse: 65 74 (!) 51   Resp: 20 20 18    Temp: 98.5 F (36.9 C) 98.4 F (36.9 C) 97.7 F (36.5 C)   TempSrc: Oral Oral Oral   SpO2: 99% 99% 98%   Weight:    65.4 kg  Height:       Exam:  General: Chronically ill-appearing female, thin  Eyes: EOMI CVS:RRR Respiratory:  normal effort LE: Warm and well-perfusedl.  Psych: patient is logical and coherent, judgement and insight appear normal, mood and affect appropriate to situation.  Data Reviewed:  Basic Metabolic Panel: Recent Labs  Lab 03/23/24 0642  NA 137  K 4.0  CL 103  CO2 24  GLUCOSE 91  BUN 24*  CREATININE 0.75  CALCIUM 9.8    CBC: Recent Labs  Lab 03/23/24 0642  WBC 7.9  NEUTROABS 4.7  HGB 13.1  HCT 42.9  MCV 94.7  PLT 275   Assessment & Plan:   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 - ID has been consulted and treatment as recommended: Currently she is on IV penicillin and end date 03/25/24  ( 10 days)  Benzathine penicillin one dose to be given on 03/26/24 Continue doxycycline for possible LGV  HSV genital ulcers Patient completed a 7-day course of valacyclovir Requests depo shot prior to dc  Bipolar 1 disorder Psychiatry has been consulted Patient is not in acute mania or depression Trazodone was added per their recommendations - she  states she has been on fluoxetine since high school and was not continued at admission. Will restart since she has tolerated chronically well.   Polysubstance use Un-housed  TOC is consulted Patient seems to be in precontemplation regarding her polysubstance use Multivitamin   DVT prophylaxis: SCD Code Status: Full Family Communication: None today   Daved JAYSON Pump, DO [facility]  If 7PM-7AM, please contact night-coverage [email or web address]   LOS: 9 days

## 2024-03-25 NOTE — Plan of Care (Signed)
   Problem: Clinical Measurements: Goal: Diagnostic test results will improve Outcome: Progressing   Problem: Pain Managment: Goal: General experience of comfort will improve and/or be controlled Outcome: Progressing   Problem: Safety: Goal: Ability to remain free from injury will improve Outcome: Progressing

## 2024-03-25 NOTE — Telephone Encounter (Signed)
 [facility] Patient Advocate Encounter ? ?Insurance verification completed.   ? ?The patient is uninsured and will need patient assistance for medication. ? ?We can complete the application and will need to meet with the patient for signatures and income documentation. ? ?Arland Hutchinson, CPhT ?Specialty Pharmacy Patient Advocate ?[facility] ?Phone: [phone] ?Fax:  [phone]  ?

## 2024-03-25 NOTE — TOC Progression Note (Signed)
 Transition of Care [facility]) - Progression Note    Patient Details  Name: Barbara Maynard MRN: [id number] Date of Birth: 16-Oct-1980  Transition of [facility] CM/SW Contact  Toy LITTIE Agar, RN Phone Number:[phone]  03/25/2024, 3:23 PM  Clinical Narrative:    Substance abuse and housing resources provided for the patient . Copies given to patient directly per patient request.      Barriers to Discharge: Continued Medical Work up               Expected Discharge Plan and Services                                               Social Drivers of Health (SDOH) Interventions SDOH Screenings   Food Insecurity: No Food Insecurity (03/14/2024)  Housing: High Risk (03/14/2024)  Transportation Needs: Unmet Transportation Needs (03/14/2024)  Utilities: At Risk (03/14/2024)  Social Connections: Unknown (03/14/2024)  Tobacco Use: High Risk (03/14/2024)    Readmission Risk Interventions     No data to display

## 2024-03-26 ENCOUNTER — Other Ambulatory Visit (HOSPITAL_COMMUNITY): Payer: Self-pay

## 2024-03-26 DIAGNOSIS — Z2981 Encounter for HIV pre-exposure prophylaxis: Secondary | ICD-10-CM

## 2024-03-26 DIAGNOSIS — R590 Localized enlarged lymph nodes: Secondary | ICD-10-CM

## 2024-03-26 LAB — CBC
HCT: 41.2 % (ref 36.0–46.0)
Hemoglobin: 13 g/dL (ref 12.0–15.0)
MCH: 29.3 pg (ref 26.0–34.0)
MCHC: 31.6 g/dL (ref 30.0–36.0)
MCV: 93 fL (ref 80.0–100.0)
Platelets: 266 K/uL (ref 150–400)
RBC: 4.43 MIL/uL (ref 3.87–5.11)
RDW: 13.2 % (ref 11.5–15.5)
WBC: 7.8 K/uL (ref 4.0–10.5)
nRBC: 0 % (ref 0.0–0.2)

## 2024-03-26 LAB — BASIC METABOLIC PANEL WITH GFR
Anion gap: 8 (ref 5–15)
BUN: 24 mg/dL — ABNORMAL HIGH (ref 6–20)
CO2: 28 mmol/L (ref 22–32)
Calcium: 9.7 mg/dL (ref 8.9–10.3)
Chloride: 102 mmol/L (ref 98–111)
Creatinine, Ser: 0.82 mg/dL (ref 0.44–1.00)
GFR, Estimated: >60 mL/min (ref >60)
Glucose, Bld: 90 mg/dL (ref 70–99)
Potassium: 4.5 mmol/L (ref 3.5–5.1)
Sodium: 138 mmol/L (ref 135–145)

## 2024-03-26 MED ORDER — TRAZODONE HCL 100 MG PO TABS
100.0000 mg | ORAL_TABLET | Freq: Every day | ORAL | 0 refills | Status: DC
  Filled 2024-03-26: qty 30, 30d supply, fill #0

## 2024-03-26 MED ORDER — DOXYCYCLINE HYCLATE 100 MG PO TABS
100.0000 mg | ORAL_TABLET | Freq: Two times a day (BID) | ORAL | 0 refills | Status: AC
  Filled 2024-03-26: qty 18, 9d supply, fill #0

## 2024-03-26 MED ORDER — RISAQUAD PO CAPS
2.0000 | ORAL_CAPSULE | Freq: Two times a day (BID) | ORAL | 0 refills | Status: AC
  Filled 2024-03-26: qty 28, 7d supply, fill #0

## 2024-03-26 MED ORDER — EMTRICITABINE-TENOFOVIR DF 200-300 MG PO TABS
1.0000 | ORAL_TABLET | Freq: Every day | ORAL | 0 refills | Status: AC
  Filled 2024-03-26: qty 30, 30d supply, fill #0

## 2024-03-26 MED ORDER — NICOTINE 21 MG/24HR TD PT24
21.0000 mg | MEDICATED_PATCH | Freq: Every day | TRANSDERMAL | 0 refills | Status: AC
  Filled 2024-03-26: qty 28, 28d supply, fill #0

## 2024-03-26 MED ORDER — EMTRICITABINE-TENOFOVIR DF 200-300 MG PO TABS
1.0000 | ORAL_TABLET | Freq: Every day | ORAL | Status: DC
  Administered 2024-03-26: 1 via ORAL
  Filled 2024-03-26: qty 1

## 2024-03-26 MED ORDER — HYDROXYZINE HCL 25 MG PO TABS
25.0000 mg | ORAL_TABLET | Freq: Three times a day (TID) | ORAL | 0 refills | Status: DC | PRN
  Filled 2024-03-26: qty 30, 10d supply, fill #0

## 2024-03-26 MED ORDER — OXYCODONE-ACETAMINOPHEN 5-325 MG PO TABS
1.0000 | ORAL_TABLET | Freq: Four times a day (QID) | ORAL | 0 refills | Status: DC | PRN
  Filled 2024-03-26: qty 20, 5d supply, fill #0

## 2024-03-26 MED ORDER — CLOTRIMAZOLE 1 % EX CREA
TOPICAL_CREAM | Freq: Two times a day (BID) | CUTANEOUS | 0 refills | Status: DC
  Filled 2024-03-26: qty 28, 28d supply, fill #0

## 2024-03-26 MED ORDER — FLUOXETINE HCL 10 MG PO CAPS
10.0000 mg | ORAL_CAPSULE | Freq: Every day | ORAL | 2 refills | Status: DC
  Filled 2024-03-26: qty 30, 30d supply, fill #0

## 2024-03-26 MED ORDER — METRONIDAZOLE 0.75 % EX GEL
Freq: Two times a day (BID) | CUTANEOUS | 0 refills | Status: DC
  Filled 2024-03-26: qty 45, 28d supply, fill #0

## 2024-03-26 NOTE — Progress Notes (Signed)
 Discharge medications delivered to patient at the bedside in a secure bag.

## 2024-03-26 NOTE — Progress Notes (Signed)
 MATCH MEDICATION ASSISTANCE CARD Pharmacies please call [phone] for claim processing assistance.  Rx BIN: [id number] Rx Group: [id number] Rx PCN: [id number] Relationship Code: 1 Person Code: 01  Patient ID (FMW):[id number]    Patient Name: Barbara Maynard   Patient DOB: 24-Nov-1980   Discharge Date:03/26/2024  Expiration Date: 04/03/2024 (must be filled within 7 days of discharge)   Dear Barbara Maynard have been approved to have the prescriptions written by your discharging physician filled through our [facility] (Medication Assistance Through [facility]) program. This program allows for a one-time (no refills) 34-day supply of selected medications for a low copay amount.  The copay is $3.00 per prescription. For instance, if you have one prescription, you will pay $3.00; for two prescriptions, you pay $6.00; for three prescriptions, you pay $9.00; and so on. Only certain pharmacies are participating in this program with [facility]. You will need to select one of the pharmacies from the attached lists and take your prescriptions, this letter, and your photo ID to one of the participating pharmacies.  We are excited that you are able to use the [facility] program to get your medications. These prescriptions must be filled within 7 days of hospital discharge or they will no longer be valid for the [facility] program. Should you have any problems with your prescriptions please contact your case management team member at [phone] for [facility] or [phone] for [facility].  Thank you, [facility]

## 2024-03-26 NOTE — Discharge Summary (Signed)
 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 *  Hospital Course:  Brookelle Pellicane is a 43 year old female who is homeless and has polysubstance use was admitted with lymphadenopathy, annular rash with central clearing and vaginal discharge.  RPR and antibodies for Treponema pallidum were positive.  She underwent an LP which ruled out neurosyphilis.    Infectious disease was consulted.  She was treated IV penicillin for 10 days to treat secondary syphilis.  10/29 -- pcn course completed this AM.  Pt doing well and without complaints other than ongoing pain from lymph node swelling.    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 Will restart since she has tolerated chronically well.  --Resumed on Prozac --PRN Atarax for anxiety --Follow up with Psychiatry outpatient   Polysubstance use Un-housed  TOC consulted to provide resources Patient seems to be in precontemplation regarding her polysubstance use Multivitamin        Consultants: ID Procedures performed: None  Disposition: Prior Diet recommendation:  Regular  DISCHARGE MEDICATION: Allergies as of 03/26/2024   No Known Allergies      Medication List     STOP taking these medications    acetaminophen  500 MG tablet Commonly known as: TYLENOL    amoxicillin  500 MG capsule Commonly known as: AMOXIL    dicyclomine  20 MG tablet Commonly known as: BENTYL    divalproex  500 MG DR tablet Commonly known as: DEPAKOTE    ibuprofen  800 MG tablet Commonly known as: ADVIL    methocarbamol  500 MG tablet Commonly known as: ROBAXIN    ondansetron  4 MG disintegrating tablet Commonly known as: ZOFRAN -ODT       TAKE these medications    acidophilus Caps capsule Take 2 capsules by mouth 2 (two) times daily for 7 days.   clotrimazole 1 % cream Commonly known as: LOTRIMIN Apply topically 2 (two) times daily.   doxycycline 100 MG tablet Commonly known as: VIBRA-TABS Take 1 tablet (100 mg total) by mouth every 12 (twelve) hours for 9 days.   emtricitabine-tenofovir 200-300 MG tablet Commonly known as: Truvada Take 1 tablet by mouth daily.   FLUoxetine 10 MG capsule Commonly  known as: PROZAC Take 1 capsule (10 mg total) by mouth daily. Start taking on: March 27, 2024   hydrOXYzine 25 MG tablet Commonly known as: ATARAX Take 1 tablet (25 mg total) by mouth 3 (three) times daily as needed for anxiety.   metroNIDAZOLE  0.75 % gel Commonly known as: METROGEL  Apply topically 2 (two) times daily.   nicotine 21 mg/24hr  patch Commonly known as: NICODERM CQ - dosed in mg/24 hours Place 1 patch (21 mg total) onto the skin daily. Start taking on: March 27, 2024   oxyCODONE -acetaminophen  5-325 MG tablet Commonly known as: PERCOCET/ROXICET Take 1 tablet by mouth every 6 (six) hours as needed for moderate pain (pain score 4-6).   traZODone 100 MG tablet Commonly known as: DESYREL Take 1 tablet (100 mg total) by mouth at bedtime.        Discharge Exam: Filed Weights   03/22/24 0418 03/23/24 0518 03/25/24 0600  Weight: 70.7 kg 64.2 kg 65.4 kg   General exam: awake, alert, no acute distress HEENT: atraumatic, clear conjunctiva, anicteric sclera, moist mucus membranes, hearing grossly normal  Respiratory system: CTAB, no wheezes, rales or rhonchi, normal respiratory effort. Cardiovascular system: normal S1/S2,  RRR, no JVD, murmurs, rubs, gallops,  no pedal edema.   Gastrointestinal system: soft, NT, ND, no HSM felt, +bowel sounds. Central nervous system: A&O x3. no gross focal neurologic deficits, normal speech Extremities: moves all , no edema, normal tone Psychiatry: normal mood, congruent affect, judgement and insight appear normal   Condition at discharge: stable  The results of significant diagnostics from this hospitalization (including imaging, microbiology, ancillary and laboratory) are listed below for reference.   Imaging Studies: DG FL GUIDED LUMBAR PUNCTURE Result Date: 03/15/2024 CLINICAL DATA:  43 year old female with history of herpes zoster, syphilis now with vision changes and headache. Lumbar puncture requested. EXAM: LUMBAR PUNCTURE UNDER FLUOROSCOPY PROCEDURE: Consent obtained from patient. An appropriate skin entry site was determined fluoroscopically. Operator donned sterile gloves and mask. Skin site was marked, then prepped with Betadine, draped in usual sterile fashion, and infiltrated locally with 1% lidocaine . A 20 gauge spinal needle advanced into the thecal sac at L4-L5  from a left interlaminar approach. Clear colorless CSF spontaneously returned, with opening pressure of 14 cm water. 12 ml CSF were collected and divided among 4 sterile vials for the requested laboratory studies. The needle was then removed. The patient tolerated the procedure well and there were no complications. FLUOROSCOPY: Radiation Exposure Index (as provided by the fluoroscopic device): 1.3 mGy Kerma IMPRESSION: Technically successful lumbar puncture under fluoroscopy. This exam was performed by Solmon Ku PA-C, and was supervised and interpreted by Wilkie Lent, MD. Electronically Signed   By: Wilkie Lent M.D.   On: 03/15/2024 13:37   CT ABDOMEN PELVIS W CONTRAST Result Date: 03/13/2024 EXAM: CT ABDOMEN AND PELVIS WITH CONTRAST 03/13/2024 11:28:51 PM TECHNIQUE: CT of the abdomen and pelvis was performed with the administration of 100 mL of iohexol  (OMNIPAQUE ) 300 MG/ML solution. Multiplanar reformatted images are provided for review. Automated exposure control, iterative reconstruction, and/or weight-based adjustment of the mA/kV was utilized to reduce the radiation dose to as low as reasonably achievable. COMPARISON: 07/05/2022 CLINICAL HISTORY: Abdominal pain, acute, nonlocalized; Lymphadenopathy, groin. Per triage note: Patient to ED by EMS from home with c/o rash x3 days ABD pain on left side, fever, headache and swollen ovary on left side reports HX of Syphilis has had unprotected sex. FINDINGS: LOWER CHEST: No acute abnormality. LIVER: The liver  is unremarkable. GALLBLADDER AND BILE DUCTS: Gallbladder is unremarkable. No biliary ductal dilatation. SPLEEN: No acute abnormality. PANCREAS: No acute abnormality. ADRENAL GLANDS: No acute abnormality. KIDNEYS, URETERS AND BLADDER: No stones in the kidneys or ureters. No hydronephrosis. No perinephric or periureteral stranding. Urinary bladder is unremarkable. GI AND BOWEL: Stomach demonstrates no acute abnormality. There is no bowel  obstruction. Normal appendix (image 49). PERITONEUM AND RETROPERITONEUM: No ascites. No free air. VASCULATURE: Aorta is normal in caliber. LYMPH NODES: Bilateral pelvic lymphadenopathy, including necrotic inguinal nodes measuring 18 mm on the right and 20 mm on the left. Bilateral external iliac nodes measuring 10 mm on the right and 17 mm on the left. REPRODUCTIVE ORGANS: Uterus is within normal limits. BONES AND SOFT TISSUES: Mild degenerative changes of the lower lumbar spine. No acute osseous abnormality. No focal soft tissue abnormality. IMPRESSION: 1. Bilateral pelvic lymphadenopathy, including necrotic inguinal nodes, as above. This appearance is likely reactive/infectious, but warrants attention on clinical follow-up. If persistent, consider tissue sampling. 2. Otherwise, no acute findings. Electronically signed by: Pinkie Pebbles MD 03/13/2024 11:50 PM EDT RP Workstation: [id number]    Microbiology: Results for orders placed or performed during the hospital encounter of 03/13/24  Wet prep, genital     Status: None   Collection Time: 03/13/24  8:43 PM   Specimen: PATH Cytology Cervicovaginal Ancillary Only  Result Value Ref Range Status   Yeast Wet Prep HPF POC NONE SEEN NONE SEEN Final   Trich, Wet Prep NONE SEEN NONE SEEN Final   Clue Cells Wet Prep HPF POC NONE SEEN NONE SEEN Final   WBC, Wet Prep HPF POC <10 <10 Final   Sperm NONE SEEN  Final    Comment: Performed at [facility], [address] [place], [place] [address]  Hsv Culture And Typing     Status: None   Collection Time: 03/13/24 11:36 PM   Specimen: Vaginal; Other  Result Value Ref Range Status   HSV Culture/Type Comment  Final    Comment: (NOTE) Negative No Herpes simplex virus isolated. Performed At: [facility] [address] [place], [place] [id number] Jennette Shorter MD Ey:[id number]    Source of Sample VAGINAL/RECTAL  Final    Comment: VAGINAL Performed at [facility], [address] [place], [place] [address]   CSF culture w Gram Stain     Status: None   Collection Time: 03/15/24 11:15 AM   Specimen: PATH Cytology CSF; Cerebrospinal Fluid  Result Value Ref Range Status   Specimen Description   Final    CSF Performed at [facility], [address], [place], [place] [address]    Special Requests   Final    NONE Performed at [facility], [address], [place], [place] [address]    Gram Stain   Final    NO RBC SEEN WBC SEEN NO ORGANISMS SEEN CYTOSPIN SMEAR    Culture   Final    NO GROWTH 3 DAYS Performed at [facility], [address], [place], [place] [address]    Report Status 03/19/2024 FINAL  Final    Labs: CBC: Recent Labs  Lab 03/23/24 0642 03/26/24 0631  WBC 7.9 7.8  NEUTROABS 4.7  --   HGB 13.1 13.0  HCT 42.9 41.2  MCV 94.7 93.0  PLT 275 266   Basic Metabolic Panel: Recent Labs  Lab 03/23/24 0642 03/26/24 0631  NA 137 138  K 4.0 4.5  CL 103  102  CO2 24 28  GLUCOSE 91 90  BUN 24* 24*  CREATININE 0.75 0.82  CALCIUM 9.8 9.7   Liver Function Tests: No results for input(s): AST, ALT, ALKPHOS, BILITOT, PROT, ALBUMIN in the last 168 hours. CBG: No results for input(s): GLUCAP in the last 168 hours.  Discharge time spent: less than 30 minutes.  Signed: Burnard DELENA Cunning, DO [facility] 03/26/2024

## 2024-03-26 NOTE — Progress Notes (Signed)
Discharge paperwork given to patient.

## 2024-03-26 NOTE — Progress Notes (Addendum)
 [facility] Infectious Diseases Follow Up Note  Patient Identification: Patient Name: Myrlene Riera MRN: [id number] Admit Date: 03/13/2024  8:09 PM Age: 43 y.o.Today's Date: 03/26/2024  Reason for Visit: Secondary syphilis, ? 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   Pelvic lymphadenopathy   Bipolar 1 disorder (HCC)   Polysubstance abuse (HCC)   Tobacco use   Hypokalemia   Rash in adult   Secondary syphilis   Syphilis   Neurosyphilis   Antibiotics: Doxycycline 10/17- Penicillin G 10/18- Valacyclovir 10/16-10/24 Fluconazole 10/25-10/27  Lines/Hardwares:   Interval Events: Remains afebrile 10/29 CBC and BMP unremarkable  Assessment 43 year old female with prior history of asthma, bipolar disorder, PTSD, shingles admitted with  # Secondary syphilis # Presumptive neurosyphilis - Prior rashes, genital lesions resolved - No visual or neurological concerns - Completed 10 days of IV penicillin G 24,000,000 units in the am - CSF FTA-ABS results unavailable/pending, was sent to [facility] last week( addendum)   # Probable HSV genital ulcer-treated with valacyclovir 1 g 3 times daily for 7 days  # Other STDs/presumed LGV # Bilateral pelvic lymphadenopathy including necrotic inguinal nodes, likely reactive/infectious- resolved  - planned for 21 days of p.o. doxycycline 100 mg p.o. twice daily  # PrEP - She is interested to start on oral PrEP prior to discharge  Recommendations - Plan to do 1 dose of 2,400,000 units of benzathine penicillin G today  - complete 21 days of p.o. doxycycline 100 mg p.o. twice daily - Will start p.o. Truvada and provide her 30-day supply - Patient has a follow-up appointment arranged at [facility] 11/25 at 10: 30 am  - Universal/standard isolation precautions ID will sign off  Rest of the management as per the primary  team. Thank you for the consult. Please page with pertinent questions or concerns. ___________________________________________________________________ Subjective patient seen and examined at the bedside.  No concerns.  She is interested to start on PrEP before discharge.   Past Medical History:  Diagnosis Date   Asthma    Bipolar 1 disorder (HCC)    Borderline personality disorder (HCC)    PTSD (post-traumatic stress disorder)    Shingles    Past Surgical History:  Procedure Laterality Date   CESAREAN SECTION     WISDOM TOOTH EXTRACTION     Vitals BP 107/61 (BP Location: Right Arm)   Pulse (!) 51   Temp 97.9 F (36.6 C) (Oral)   Resp 14   Ht 5' 7 (1.702 m)   Wt 65.4 kg   LMP 02/27/2024   SpO2 97%   BMI 22.58 kg/m     Physical Exam Constitutional: Adult female lying in the bed, not in acute distress    Comments: HEENT WNL  Cardiovascular:     Rate and Rhythm: Normal rate and regular rhythm.     Heart sounds: S1 and S2  Pulmonary:     Effort: Pulmonary effort is normal.     Comments: Normal breath sounds  Abdominal:     Palpations: Abdomen is soft.     Tenderness: Nondistended and nontender Deferred GU exam, she reports no vaginal concerns, Per prior notes, shotty inguinal lymph nodes resolved Musculoskeletal:        General: No swelling or tenderness in peripheral joints  Skin:    Comments: No rashes  Neurological:     General: Awake, alert and oriented, grossly nonfocal  Psychiatric:        Mood and  Affect: Mood normal.   Pertinent Microbiology Results for orders placed or performed during the hospital encounter of 03/13/24  Wet prep, genital     Status: None   Collection Time: 03/13/24  8:43 PM   Specimen: PATH Cytology Cervicovaginal Ancillary Only  Result Value Ref Range Status   Yeast Wet Prep HPF POC NONE SEEN NONE SEEN Final   Trich, Wet Prep NONE SEEN NONE SEEN Final   Clue Cells Wet Prep HPF POC NONE SEEN NONE SEEN Final   WBC, Wet Prep  HPF POC <10 <10 Final   Sperm NONE SEEN  Final    Comment: Performed at [facility], [address], [place], [place] [address]  Hsv Culture And Typing     Status: None   Collection Time: 03/13/24 11:36 PM   Specimen: Vaginal; Other  Result Value Ref Range Status   HSV Culture/Type Comment  Final    Comment: (NOTE) Negative No Herpes simplex virus isolated. Performed At: [facility] [address] [place], [place] [id number] Jennette Shorter MD Ey:[id number]    Source of Sample VAGINAL/RECTAL  Final    Comment: VAGINAL Performed at [facility], [address] [place], [place] [address]   CSF culture w Gram Stain     Status: None   Collection Time: 03/15/24 11:15 AM   Specimen: PATH Cytology CSF; Cerebrospinal Fluid  Result Value Ref Range Status   Specimen Description   Final    CSF Performed at [facility], [address] [place], [place] [address]    Special Requests   Final    NONE Performed at [facility], [address] [place], [place] [address]    Gram Stain   Final    NO RBC SEEN WBC SEEN NO ORGANISMS SEEN CYTOSPIN SMEAR    Culture   Final    NO GROWTH 3 DAYS Performed at [facility], [address] [place], [place] [address]    Report Status 03/19/2024 FINAL  Final   Pertinent Lab.    Latest Ref Rng & Units 03/26/2024    6:31 AM 03/23/2024    6:42 AM 03/16/2024    6:13 AM  CBC  WBC 4.0 - 10.5 K/uL 7.8  7.9  7.6   Hemoglobin 12.0 - 15.0 g/dL 86.9  86.8  86.3   Hematocrit 36.0 - 46.0 % 41.2  42.9  42.0   Platelets 150 - 400 K/uL 266  275  253       Latest Ref Rng & Units 03/26/2024    6:31 AM 03/23/2024    6:42 AM 03/16/2024    6:13 AM  CMP  Glucose 70 - 99 mg/dL 90  91  889   BUN 6 - 20 mg/dL 24  24  15    Creatinine 0.44 - 1.00 mg/dL 9.17  9.24  9.33   Sodium 135 - 145 mmol/L 138  137  139   Potassium 3.5 - 5.1 mmol/L 4.5  4.0  4.3   Chloride 98 -  111 mmol/L 102  103  104   CO2 22 - 32 mmol/L 28  24  25    Calcium 8.9 - 10.3 mg/dL 9.7  9.8  9.6   Total Protein 6.5 - 8.1 g/dL   7.0   Total Bilirubin 0.0 - 1.2 mg/dL   <9.7   Alkaline Phos 38 - 126 U/L   63   AST 15 - 41 U/L   30  ALT 0 - 44 U/L   26    Pertinent Imaging today Plain films and CT images have been personally visualized and interpreted; radiology reports have been reviewed. Decision making incorporated into the Impression /   DG FL GUIDED LUMBAR PUNCTURE Result Date: 03/15/2024 CLINICAL DATA:  43 year old female with history of herpes zoster, syphilis now with vision changes and headache. Lumbar puncture requested. EXAM: LUMBAR PUNCTURE UNDER FLUOROSCOPY PROCEDURE: Consent obtained from patient. An appropriate skin entry site was determined fluoroscopically. Operator donned sterile gloves and mask. Skin site was marked, then prepped with Betadine, draped in usual sterile fashion, and infiltrated locally with 1% lidocaine . A 20 gauge spinal needle advanced into the thecal sac at L4-L5 from a left interlaminar approach. Clear colorless CSF spontaneously returned, with opening pressure of 14 cm water. 12 ml CSF were collected and divided among 4 sterile vials for the requested laboratory studies. The needle was then removed. The patient tolerated the procedure well and there were no complications. FLUOROSCOPY: Radiation Exposure Index (as provided by the fluoroscopic device): 1.3 mGy Kerma IMPRESSION: Technically successful lumbar puncture under fluoroscopy. This exam was performed by Solmon Ku PA-C, and was supervised and interpreted by Wilkie Lent, MD. Electronically Signed   By: Wilkie Lent M.D.   On: 03/15/2024 13:37   CT ABDOMEN PELVIS W CONTRAST Result Date: 03/13/2024 EXAM: CT ABDOMEN AND PELVIS WITH CONTRAST 03/13/2024 11:28:51 PM TECHNIQUE: CT of the abdomen and pelvis was performed with the administration of 100 mL of iohexol  (OMNIPAQUE ) 300 MG/ML solution.  Multiplanar reformatted images are provided for review. Automated exposure control, iterative reconstruction, and/or weight-based adjustment of the mA/kV was utilized to reduce the radiation dose to as low as reasonably achievable. COMPARISON: 07/05/2022 CLINICAL HISTORY: Abdominal pain, acute, nonlocalized; Lymphadenopathy, groin. Per triage note: Patient to ED by EMS from home with c/o rash x3 days ABD pain on left side, fever, headache and swollen ovary on left side reports HX of Syphilis has had unprotected sex. FINDINGS: LOWER CHEST: No acute abnormality. LIVER: The liver is unremarkable. GALLBLADDER AND BILE DUCTS: Gallbladder is unremarkable. No biliary ductal dilatation. SPLEEN: No acute abnormality. PANCREAS: No acute abnormality. ADRENAL GLANDS: No acute abnormality. KIDNEYS, URETERS AND BLADDER: No stones in the kidneys or ureters. No hydronephrosis. No perinephric or periureteral stranding. Urinary bladder is unremarkable. GI AND BOWEL: Stomach demonstrates no acute abnormality. There is no bowel obstruction. Normal appendix (image 49). PERITONEUM AND RETROPERITONEUM: No ascites. No free air. VASCULATURE: Aorta is normal in caliber. LYMPH NODES: Bilateral pelvic lymphadenopathy, including necrotic inguinal nodes measuring 18 mm on the right and 20 mm on the left. Bilateral external iliac nodes measuring 10 mm on the right and 17 mm on the left. REPRODUCTIVE ORGANS: Uterus is within normal limits. BONES AND SOFT TISSUES: Mild degenerative changes of the lower lumbar spine. No acute osseous abnormality. No focal soft tissue abnormality. IMPRESSION: 1. Bilateral pelvic lymphadenopathy, including necrotic inguinal nodes, as above. This appearance is likely reactive/infectious, but warrants attention on clinical follow-up. If persistent, consider tissue sampling. 2. Otherwise, no acute findings. Electronically signed by: Pinkie Pebbles MD 03/13/2024 11:50 PM EDT RP Workstation: [id number]   I spent 50  minutes involved in face-to-face and non-face-to-face activities for this patient on the day of the visit. Professional time spent includes the following activities: Preparing to see the patient (review of tests), Obtaining and reviewing separately obtained history (hospitalist progress note, note from Dr. Luiz, notes from Dr. Searcy), Performing a medically appropriate examination  and evaluation , Ordering medications/labs, referring and communicating with other health care professionals, Documenting clinical information in the EMR, Independently interpreting results (not separately reported), Communicating results to the patient, Counseling and educating the patient and Care coordination (not separately reported).   Plan d/w requesting provider as well as ID pharm D  Of note, portions of this note may have been created with voice recognition software. While this note has been edited for accuracy, occasional wrong-word or 'sound-a-like' substitutions may have occurred due to the inherent limitations of voice recognition software.   Electronically signed by:   Annalee Orem, MD Infectious Disease Physician [facility] Pager: [phone]

## 2024-03-27 ENCOUNTER — Other Ambulatory Visit (HOSPITAL_COMMUNITY): Payer: Self-pay

## 2024-03-27 LAB — MISC LABCORP TEST (SEND OUT): Labcorp test code: 6445

## 2024-03-29 ENCOUNTER — Other Ambulatory Visit (HOSPITAL_COMMUNITY): Payer: Self-pay

## 2024-03-31 ENCOUNTER — Other Ambulatory Visit (HOSPITAL_COMMUNITY): Payer: Self-pay

## 2024-04-03 ENCOUNTER — Ambulatory Visit: Payer: Self-pay | Admitting: Infectious Diseases

## 2024-04-22 ENCOUNTER — Inpatient Hospital Stay: Payer: Self-pay | Admitting: Infectious Diseases

## 2024-06-26 ENCOUNTER — Emergency Department (HOSPITAL_COMMUNITY): Payer: Self-pay

## 2024-06-26 ENCOUNTER — Emergency Department (HOSPITAL_COMMUNITY)
Admission: EM | Admit: 2024-06-26 | Discharge: 2024-06-27 | Disposition: A | Discharge status: Home / Self Care | Payer: Self-pay | Attending: Emergency Medicine | Admitting: Emergency Medicine

## 2024-06-26 DIAGNOSIS — R058 Other specified cough: Secondary | ICD-10-CM | POA: Insufficient documentation

## 2024-06-26 DIAGNOSIS — Z202 Contact with and (suspected) exposure to infections with a predominantly sexual mode of transmission: Secondary | ICD-10-CM | POA: Insufficient documentation

## 2024-06-26 DIAGNOSIS — R112 Nausea with vomiting, unspecified: Secondary | ICD-10-CM | POA: Insufficient documentation

## 2024-06-26 DIAGNOSIS — S71131A Puncture wound without foreign body, right thigh, initial encounter: Secondary | ICD-10-CM | POA: Insufficient documentation

## 2024-06-26 DIAGNOSIS — Z23 Encounter for immunization: Secondary | ICD-10-CM | POA: Insufficient documentation

## 2024-06-26 DIAGNOSIS — R300 Dysuria: Secondary | ICD-10-CM | POA: Insufficient documentation

## 2024-06-26 DIAGNOSIS — T148XXA Other injury of unspecified body region, initial encounter: Secondary | ICD-10-CM

## 2024-06-26 DIAGNOSIS — N898 Other specified noninflammatory disorders of vagina: Secondary | ICD-10-CM | POA: Insufficient documentation

## 2024-06-26 DIAGNOSIS — R590 Localized enlarged lymph nodes: Secondary | ICD-10-CM | POA: Insufficient documentation

## 2024-06-26 LAB — CBC WITH DIFFERENTIAL/PLATELET
Abs Immature Granulocytes: 0.04 10*3/uL (ref 0.00–0.07)
Basophils Absolute: 0.1 10*3/uL (ref 0.0–0.1)
Basophils Relative: 1 %
Eosinophils Absolute: 0.1 10*3/uL (ref 0.0–0.5)
Eosinophils Relative: 1 %
HCT: 36 % (ref 36.0–46.0)
Hemoglobin: 12 g/dL (ref 12.0–15.0)
Immature Granulocytes: 0 %
Lymphocytes Relative: 15 %
Lymphs Abs: 2.1 10*3/uL (ref 0.7–4.0)
MCH: 30.2 pg (ref 26.0–34.0)
MCHC: 33.3 g/dL (ref 30.0–36.0)
MCV: 90.5 fL (ref 80.0–100.0)
Monocytes Absolute: 0.8 10*3/uL (ref 0.1–1.0)
Monocytes Relative: 6 %
Neutro Abs: 10.9 10*3/uL — ABNORMAL HIGH (ref 1.7–7.7)
Neutrophils Relative %: 77 %
Platelets: 254 10*3/uL (ref 150–400)
RBC: 3.98 MIL/uL (ref 3.87–5.11)
RDW: 13.1 % (ref 11.5–15.5)
WBC: 14.1 10*3/uL — ABNORMAL HIGH (ref 4.0–10.5)
nRBC: 0 % (ref 0.0–0.2)

## 2024-06-26 LAB — WET PREP, GENITAL
Sperm: NONE SEEN
Trich, Wet Prep: NONE SEEN
WBC, Wet Prep HPF POC: <10
Yeast Wet Prep HPF POC: NONE SEEN

## 2024-06-26 MED ORDER — TETANUS-DIPHTH-ACELL PERTUSSIS 5-2-15.5 LF-MCG/0.5 IM SUSP
0.5000 mL | Freq: Once | INTRAMUSCULAR | Status: AC
  Administered 2024-06-27: 0.5 mL via INTRAMUSCULAR
  Filled 2024-06-26: qty 0.5

## 2024-06-26 MED ORDER — LIDOCAINE-EPINEPHRINE (PF) 2 %-1:200000 IJ SOLN
10.0000 mL | Freq: Once | INTRAMUSCULAR | Status: AC
  Administered 2024-06-27: 10 mL
  Filled 2024-06-26: qty 20

## 2024-06-26 MED ORDER — OXYCODONE-ACETAMINOPHEN 5-325 MG PO TABS
1.0000 | ORAL_TABLET | Freq: Once | ORAL | Status: AC
  Administered 2024-06-26: 1 via ORAL
  Filled 2024-06-26: qty 1
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
## 2024-06-26 NOTE — ED Notes (Signed)
 Informed patient of need for urine sample, patient states she is unable to urinate at this time
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
## 2024-06-26 NOTE — ED Notes (Signed)
 New dressing placed on wound on right thigh by Upstill, PA-C

## 2024-06-27 ENCOUNTER — Ambulatory Visit (HOSPITAL_COMMUNITY)
Admission: EM | Admit: 2024-06-27 | Discharge: 2024-06-30 | Disposition: A | Source: Home / Self Care | Attending: Psychiatry | Admitting: Psychiatry

## 2024-06-27 ENCOUNTER — Other Ambulatory Visit: Payer: Self-pay

## 2024-06-27 ENCOUNTER — Emergency Department (HOSPITAL_COMMUNITY)
Admission: EM | Admit: 2024-06-27 | Discharge: 2024-06-27 | Disposition: A | Discharge status: Home / Self Care | Payer: Self-pay | Attending: Emergency Medicine | Admitting: Emergency Medicine

## 2024-06-27 ENCOUNTER — Other Ambulatory Visit (HOSPITAL_COMMUNITY): Payer: Self-pay

## 2024-06-27 ENCOUNTER — Encounter (HOSPITAL_COMMUNITY): Payer: Self-pay

## 2024-06-27 DIAGNOSIS — F1990 Other psychoactive substance use, unspecified, uncomplicated: Secondary | ICD-10-CM

## 2024-06-27 DIAGNOSIS — R52 Pain, unspecified: Secondary | ICD-10-CM

## 2024-06-27 DIAGNOSIS — F1120 Opioid dependence, uncomplicated: Secondary | ICD-10-CM

## 2024-06-27 DIAGNOSIS — Z5900 Homelessness unspecified: Secondary | ICD-10-CM

## 2024-06-27 DIAGNOSIS — F1920 Other psychoactive substance dependence, uncomplicated: Secondary | ICD-10-CM

## 2024-06-27 LAB — URINALYSIS, W/ REFLEX TO CULTURE (INFECTION SUSPECTED)
Bilirubin Urine: NEGATIVE
Glucose, UA: NEGATIVE mg/dL
Hgb urine dipstick: NEGATIVE
Ketones, ur: NEGATIVE mg/dL
Leukocytes,Ua: NEGATIVE
Nitrite: POSITIVE — AB
Protein, ur: NEGATIVE mg/dL
Specific Gravity, Urine: 1.029 (ref 1.005–1.030)
pH: 5 (ref 5.0–8.0)

## 2024-06-27 LAB — COMPREHENSIVE METABOLIC PANEL WITH GFR
ALT: 13 U/L (ref 0–44)
ALT: 12 U/L (ref 0–44)
AST: 20 U/L (ref 15–41)
AST: 21 U/L (ref 15–41)
Albumin: 3.9 g/dL (ref 3.5–5.0)
Albumin: 3.8 g/dL (ref 3.5–5.0)
Alkaline Phosphatase: 43 U/L (ref 38–126)
Alkaline Phosphatase: 40 U/L (ref 38–126)
Anion gap: 10 (ref 5–15)
Anion gap: 8 (ref 5–15)
BUN: 19 mg/dL (ref 6–20)
BUN: 15 mg/dL (ref 6–20)
CO2: 25 mmol/L (ref 22–32)
CO2: 25 mmol/L (ref 22–32)
Calcium: 8.9 mg/dL (ref 8.9–10.3)
Calcium: 8.8 mg/dL — ABNORMAL LOW (ref 8.9–10.3)
Chloride: 107 mmol/L (ref 98–111)
Chloride: 108 mmol/L (ref 98–111)
Creatinine, Ser: 0.83 mg/dL (ref 0.44–1.00)
Creatinine, Ser: 0.73 mg/dL (ref 0.44–1.00)
GFR, Estimated: >60 mL/min
GFR, Estimated: >60 mL/min
Glucose, Bld: 141 mg/dL — ABNORMAL HIGH (ref 70–99)
Glucose, Bld: 89 mg/dL (ref 70–99)
Potassium: 3.3 mmol/L — ABNORMAL LOW (ref 3.5–5.1)
Potassium: 4.4 mmol/L (ref 3.5–5.1)
Sodium: 142 mmol/L (ref 135–145)
Sodium: 141 mmol/L (ref 135–145)
Total Bilirubin: <0.2 mg/dL (ref 0.0–1.2)
Total Bilirubin: 0.3 mg/dL (ref 0.0–1.2)
Total Protein: 6 g/dL — ABNORMAL LOW (ref 6.5–8.1)
Total Protein: 6.1 g/dL — ABNORMAL LOW (ref 6.5–8.1)

## 2024-06-27 LAB — POCT URINE DRUG SCREEN - MANUAL ENTRY (I-SCREEN)
POC Amphetamine UR: NOT DETECTED
POC Buprenorphine (BUP): NOT DETECTED
POC Cocaine UR: POSITIVE — AB
POC Marijuana UR: POSITIVE — AB
POC Methadone UR: NOT DETECTED
POC Methamphetamine UR: NOT DETECTED
POC Morphine: POSITIVE — AB
POC Oxazepam (BZO): NOT DETECTED
POC Oxycodone UR: POSITIVE — AB
POC Secobarbital (BAR): NOT DETECTED

## 2024-06-27 LAB — RESP PANEL BY RT-PCR (RSV, FLU A&B, COVID)  RVPGX2
Influenza A by PCR: NEGATIVE
Influenza B by PCR: NEGATIVE
Resp Syncytial Virus by PCR: NEGATIVE
SARS Coronavirus 2 by RT PCR: NEGATIVE

## 2024-06-27 LAB — GC/CHLAMYDIA PROBE AMP (~~LOC~~) NOT AT ARMC
Chlamydia: NEGATIVE
Comment: NEGATIVE
Comment: NORMAL
Neisseria Gonorrhea: NEGATIVE

## 2024-06-27 LAB — PREGNANCY, URINE: Preg Test, Ur: NEGATIVE

## 2024-06-27 LAB — CK: Total CK: 212 U/L (ref 38–234)

## 2024-06-27 LAB — ETHANOL: Alcohol, Ethyl (B): <15 mg/dL

## 2024-06-27 LAB — TSH: TSH: 1.74 u[IU]/mL (ref 0.350–4.500)

## 2024-06-27 MED ORDER — MAGNESIUM HYDROXIDE 400 MG/5ML PO SUSP: 30.0000 mL | Freq: Every day | ORAL | Status: DC | PRN

## 2024-06-27 MED ORDER — DIPHENHYDRAMINE HCL 50 MG PO CAPS: 50.0000 mg | ORAL_CAPSULE | Freq: Three times a day (TID) | ORAL | Status: DC | PRN

## 2024-06-27 MED ORDER — DOXYCYCLINE HYCLATE 100 MG PO TABS
100.0000 mg | ORAL_TABLET | Freq: Two times a day (BID) | ORAL | Status: DC
  Administered 2024-06-28 – 2024-06-30 (×5): 100 mg via ORAL
  Filled 2024-06-27 (×5): qty 1

## 2024-06-27 MED ORDER — METRONIDAZOLE 0.75 % VA GEL
1.0000 | Freq: Two times a day (BID) | VAGINAL | 0 refills | Status: DC
  Filled 2024-06-27: qty 70, 4d supply, fill #0

## 2024-06-27 MED ORDER — CLONIDINE HCL 0.1 MG PO TABS: 0.1000 mg | ORAL_TABLET | ORAL | Status: DC

## 2024-06-27 MED ORDER — METHOCARBAMOL 500 MG PO TABS: 500.0000 mg | ORAL_TABLET | Freq: Three times a day (TID) | ORAL | Status: DC | PRN

## 2024-06-27 MED ORDER — DIPHENHYDRAMINE HCL 50 MG/ML IJ SOLN: 50.0000 mg | Freq: Three times a day (TID) | INTRAMUSCULAR | Status: DC | PRN

## 2024-06-27 MED ORDER — LORAZEPAM 2 MG/ML IJ SOLN: 2.0000 mg | Freq: Three times a day (TID) | INTRAMUSCULAR | Status: DC | PRN

## 2024-06-27 MED ORDER — HALOPERIDOL LACTATE 5 MG/ML IJ SOLN: 5.0000 mg | Freq: Three times a day (TID) | INTRAMUSCULAR | Status: DC | PRN

## 2024-06-27 MED ORDER — HYDROXYZINE HCL 25 MG PO TABS
25.0000 mg | ORAL_TABLET | Freq: Four times a day (QID) | ORAL | Status: DC | PRN
  Administered 2024-06-29: 25 mg via ORAL
  Filled 2024-06-27: qty 1

## 2024-06-27 MED ORDER — LOPERAMIDE HCL 2 MG PO CAPS: 2.0000 mg | ORAL_CAPSULE | ORAL | Status: DC | PRN

## 2024-06-27 MED ORDER — CLONIDINE HCL 0.1 MG PO TABS: 0.1000 mg | ORAL_TABLET | Freq: Every day | ORAL | Status: DC

## 2024-06-27 MED ORDER — NAPROXEN 500 MG PO TABS: 500.0000 mg | ORAL_TABLET | Freq: Two times a day (BID) | ORAL | Status: DC | PRN

## 2024-06-27 MED ORDER — ALUM & MAG HYDROXIDE-SIMETH 200-200-20 MG/5ML PO SUSP: 30.0000 mL | ORAL | Status: DC | PRN

## 2024-06-27 MED ORDER — HALOPERIDOL LACTATE 5 MG/ML IJ SOLN: 10.0000 mg | Freq: Three times a day (TID) | INTRAMUSCULAR | Status: DC | PRN

## 2024-06-27 MED ORDER — NICOTINE 21 MG/24HR TD PT24: 21.0000 mg | MEDICATED_PATCH | Freq: Every day | TRANSDERMAL | Status: DC | PRN

## 2024-06-27 MED ORDER — AZITHROMYCIN 250 MG PO TABS
1000.0000 mg | ORAL_TABLET | Freq: Once | ORAL | Status: AC
  Administered 2024-06-27: 1000 mg via ORAL
  Filled 2024-06-27: qty 4

## 2024-06-27 MED ORDER — ACETAMINOPHEN 325 MG PO TABS
650.0000 mg | ORAL_TABLET | Freq: Four times a day (QID) | ORAL | Status: DC | PRN
  Administered 2024-06-29: 650 mg via ORAL
  Filled 2024-06-27: qty 2

## 2024-06-27 MED ORDER — HALOPERIDOL 5 MG PO TABS: 5.0000 mg | ORAL_TABLET | Freq: Three times a day (TID) | ORAL | Status: DC | PRN

## 2024-06-27 MED ORDER — DOXYCYCLINE HYCLATE 100 MG PO TABS
100.0000 mg | ORAL_TABLET | Freq: Two times a day (BID) | ORAL | 0 refills | Status: DC
  Filled 2024-06-27: qty 20, 10d supply, fill #0

## 2024-06-27 MED ORDER — CLONIDINE HCL 0.1 MG PO TABS
0.1000 mg | ORAL_TABLET | Freq: Four times a day (QID) | ORAL | Status: DC
  Administered 2024-06-27 (×2): 0.1 mg via ORAL
  Filled 2024-06-27 (×3): qty 1

## 2024-06-27 MED ORDER — ONDANSETRON 4 MG PO TBDP: 4.0000 mg | ORAL_TABLET | Freq: Four times a day (QID) | ORAL | Status: DC | PRN

## 2024-06-27 MED ORDER — DICYCLOMINE HCL 20 MG PO TABS: 20.0000 mg | ORAL_TABLET | Freq: Four times a day (QID) | ORAL | Status: DC | PRN

## 2024-06-27 MED ORDER — CEFTRIAXONE SODIUM 500 MG IJ SOLR
500.0000 mg | Freq: Once | INTRAMUSCULAR | Status: AC
  Administered 2024-06-27: 500 mg via INTRAMUSCULAR
  Filled 2024-06-27: qty 500

## 2024-06-27 MED ORDER — HYDROXYZINE HCL 25 MG PO TABS
25.0000 mg | ORAL_TABLET | Freq: Three times a day (TID) | ORAL | Status: DC | PRN
  Administered 2024-06-27 – 2024-06-28 (×2): 25 mg via ORAL
  Filled 2024-06-27 (×2): qty 1
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
## 2024-06-27 NOTE — ED Notes (Addendum)
 Wound dressed with abd pad and kerlex gauze by Upstill, PA-C at this time.
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
## 2024-06-27 NOTE — ED Triage Notes (Signed)
 Pt also reporting generalized body aches and  malase
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
## 2024-06-27 NOTE — Progress Notes (Signed)
 Pt was discharged from [facility] ED earlier today and has returned. She has meds and substance abuse resources from previous dc. Disposition pending labs.

## 2024-06-27 NOTE — ED Notes (Signed)
 Pt sleeping in bed, no acute distress noted. Respirations even and unlabored. Continue to monitor for safety.

## 2024-06-27 NOTE — ED Notes (Signed)
 Wound re-dressed with new abdominal pad and wrapped with kerlex gauze and coband per the request of Upstill, PA-C. Small laceration on right shin cleaned and dressing placed as well. Patient denies any needs/complaints at this time.

## 2024-06-27 NOTE — ED Triage Notes (Signed)
 Pt coming in for detox pt reports that crack is her drug of choice but she dose everything.

## 2024-06-27 NOTE — ED Triage Notes (Signed)
 Pt reports that the last time she used was last night

## 2024-06-27 NOTE — ED Notes (Signed)
 Labs sent one dark green, one yellow tube, one red tube with patients labels and order Req in patient's name.

## 2024-06-28 MED ORDER — TRIPLE ANTIBIOTIC 3.5-400-5000 EX OINT
1.0000 | TOPICAL_OINTMENT | Freq: Three times a day (TID) | CUTANEOUS | Status: DC
  Administered 2024-06-28 – 2024-06-30 (×3): 1 via TOPICAL
  Filled 2024-06-28 (×4): qty 1

## 2024-06-28 NOTE — ED Notes (Signed)
 Patient appears to be resting with equal and unlabored respirations. No appeared or reported distress. Monitor for safety.

## 2024-06-28 NOTE — ED Notes (Signed)
 Patient calm and pleasant.  She has been quiet however makes needs known to staff.  She has eaten and is without complaint or distress.  Wound on left leg without swelling or bleeding.  Sutures intact.  No evidence of infection and no reports of pain.  No signs of withdrawal.  Will monitor and provide safe environment.

## 2024-06-28 NOTE — ED Notes (Signed)
 Pt denies SI/HI/AVH. No reports of pain. Compliant with scheduled meds; prn atarax  given. She was calm, cooperative, depressed, and pleasant. No behavioral concerns at this time. Pt sleeping in bed, no acute distress noted. Respirations even and unlabored. Continue to monitor for safety.

## 2024-06-28 NOTE — ED Notes (Signed)
 Patient is in the bed calm and composed, NAD. Respirations even and unlabored. Denies SI/HI/AVH.Wound on thigh CDI. Will monitor for safety.

## 2024-06-28 NOTE — ED Notes (Signed)
 Pt sleeping in bed, no acute distress noted. Respirations even and unlabored. Continue to monitor for safety.
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
## 2024-06-29 ENCOUNTER — Other Ambulatory Visit (HOSPITAL_COMMUNITY): Payer: Self-pay

## 2024-06-29 MED ORDER — TRAZODONE HCL 50 MG PO TABS
50.0000 mg | ORAL_TABLET | Freq: Every evening | ORAL | Status: DC | PRN
  Administered 2024-06-29: 50 mg via ORAL
  Filled 2024-06-29: qty 1

## 2024-06-29 NOTE — ED Notes (Signed)
 Patient appears to be sleeping with equal and unlabored respirations. No appeared or reported distress. Monitor for safety.

## 2024-06-29 NOTE — ED Notes (Signed)
 Patient is calm and cooperative with care.  Patient ate breakfast and took meds without issue.  Patient denies avh shi or plan.  She is pleasant and is presently watching tV.

## 2024-06-29 NOTE — ED Notes (Signed)
 Pt denies SI/HI/AVH. L leg pain managed with prn tylenol . Compliant with scheduled meds; prn trazodone  given. She was calm, cooperative, and pleasant. No behavioral concerns at this time. Pt sleeping in bed, no acute distress noted. Respirations even and unlabored. Continue to monitor for safety.

## 2024-06-29 NOTE — ED Notes (Signed)
 Pt resting in bed, watching TV. No acute distress noted. Continue to monitor for safety.

## 2024-06-30 ENCOUNTER — Other Ambulatory Visit (HOSPITAL_COMMUNITY): Payer: Self-pay

## 2024-06-30 ENCOUNTER — Encounter (HOSPITAL_COMMUNITY): Payer: Self-pay

## 2024-06-30 MED ORDER — FLUOXETINE HCL 10 MG PO CAPS: 10.0000 mg | ORAL_CAPSULE | Freq: Every day | ORAL | 0 refills | Status: AC

## 2024-06-30 MED ORDER — TRAZODONE HCL 50 MG PO TABS: 50.0000 mg | ORAL_TABLET | Freq: Every evening | ORAL | 0 refills | Status: AC | PRN

## 2024-06-30 NOTE — Discharge Instructions (Signed)

## 2024-06-30 NOTE — ED Notes (Signed)
 Pt sleeping in bed, no acute distress noted. Respirations even and unlabored. Continue to monitor for safety.
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
## 2024-06-30 NOTE — ED Notes (Signed)
 Pt is currently sleeping, no distress noted, environmental check complete, will continue to monitor patient for safety.
